# Patient Record
Sex: Male | Born: 1945 | Race: Black or African American | Hispanic: No | Marital: Married | Smoking: Never smoker
Health system: Southern US, Community
[De-identification: ages and names within clinical notes are randomized; demographics above are authoritative.]

## PROBLEM LIST (undated history)

## (undated) DIAGNOSIS — N401 Enlarged prostate with lower urinary tract symptoms: Secondary | ICD-10-CM

## (undated) DIAGNOSIS — T4145XA Adverse effect of unspecified anesthetic, initial encounter: Secondary | ICD-10-CM

## (undated) DIAGNOSIS — T8859XA Other complications of anesthesia, initial encounter: Secondary | ICD-10-CM

## (undated) DIAGNOSIS — C61 Malignant neoplasm of prostate: Secondary | ICD-10-CM

## (undated) DIAGNOSIS — G629 Polyneuropathy, unspecified: Secondary | ICD-10-CM

## (undated) DIAGNOSIS — E785 Hyperlipidemia, unspecified: Secondary | ICD-10-CM

## (undated) DIAGNOSIS — F419 Anxiety disorder, unspecified: Secondary | ICD-10-CM

## (undated) DIAGNOSIS — M1990 Unspecified osteoarthritis, unspecified site: Secondary | ICD-10-CM

## (undated) DIAGNOSIS — M51369 Other intervertebral disc degeneration, lumbar region without mention of lumbar back pain or lower extremity pain: Secondary | ICD-10-CM

## (undated) DIAGNOSIS — J301 Allergic rhinitis due to pollen: Secondary | ICD-10-CM

## (undated) DIAGNOSIS — N3941 Urge incontinence: Secondary | ICD-10-CM

## (undated) DIAGNOSIS — M5136 Other intervertebral disc degeneration, lumbar region: Secondary | ICD-10-CM

## (undated) HISTORY — PX: PROSTATE BIOPSY: SHX241

## (undated) HISTORY — DX: Anxiety disorder, unspecified: F41.9

---

## 2003-01-31 ENCOUNTER — Emergency Department (HOSPITAL_COMMUNITY): Admission: EM | Admit: 2003-01-31 | Discharge: 2003-01-31 | Payer: Self-pay | Admitting: Emergency Medicine

## 2003-01-31 ENCOUNTER — Encounter: Payer: Self-pay | Admitting: Emergency Medicine

## 2003-02-13 ENCOUNTER — Ambulatory Visit (HOSPITAL_COMMUNITY): Admission: RE | Admit: 2003-02-13 | Discharge: 2003-02-13 | Payer: Self-pay | Admitting: General Surgery

## 2003-09-07 ENCOUNTER — Ambulatory Visit (HOSPITAL_COMMUNITY): Admission: RE | Admit: 2003-09-07 | Discharge: 2003-09-07 | Payer: Self-pay | Admitting: Family Medicine

## 2003-11-14 ENCOUNTER — Ambulatory Visit (HOSPITAL_COMMUNITY): Admission: RE | Admit: 2003-11-14 | Discharge: 2003-11-14 | Payer: Self-pay | Admitting: General Surgery

## 2003-11-14 HISTORY — PX: OTHER SURGICAL HISTORY: SHX169

## 2003-11-27 ENCOUNTER — Ambulatory Visit (HOSPITAL_COMMUNITY): Admission: RE | Admit: 2003-11-27 | Discharge: 2003-11-27 | Payer: Self-pay | Admitting: Family Medicine

## 2009-03-15 ENCOUNTER — Ambulatory Visit (HOSPITAL_COMMUNITY): Admission: RE | Admit: 2009-03-15 | Discharge: 2009-03-15 | Payer: Self-pay | Admitting: General Surgery

## 2010-02-18 ENCOUNTER — Ambulatory Visit (HOSPITAL_COMMUNITY): Admission: RE | Admit: 2010-02-18 | Discharge: 2010-02-18 | Payer: Self-pay | Admitting: Family Medicine

## 2010-02-26 ENCOUNTER — Emergency Department (HOSPITAL_COMMUNITY): Admission: EM | Admit: 2010-02-26 | Discharge: 2010-02-26 | Payer: Self-pay | Admitting: General Surgery

## 2010-03-21 ENCOUNTER — Ambulatory Visit (HOSPITAL_COMMUNITY): Admission: RE | Admit: 2010-03-21 | Discharge: 2010-03-21 | Payer: Self-pay | Admitting: General Surgery

## 2010-03-21 HISTORY — PX: OTHER SURGICAL HISTORY: SHX169

## 2010-10-31 LAB — BASIC METABOLIC PANEL
GFR calc Af Amer: >60 mL/min (ref >60)
GFR calc non Af Amer: >60 mL/min (ref >60)
Glucose, Bld: 108 mg/dL — ABNORMAL HIGH (ref 70–99)
Potassium: 4 mEq/L (ref 3.5–5.1)
Sodium: 135 mEq/L (ref 135–145)

## 2010-10-31 LAB — SURGICAL PCR SCREEN
MRSA, PCR: NEGATIVE
Staphylococcus aureus: NEGATIVE

## 2010-10-31 LAB — HEMOGLOBIN AND HEMATOCRIT, BLOOD: HCT: 42 % (ref 39.0–52.0)

## 2010-12-30 NOTE — H&P (Signed)
NAME:  WAKE, [name] NO.:  [id number]   MEDICAL RECORD NO.:  [id number]          PATIENT TYPE:  AMB   LOCATION:  DAY                           FACILITY:  [facility]   PHYSICIAN:  Dalia Heading, M.D.  DATE OF BIRTH:  1945/09/14   DATE OF ADMISSION:  DATE OF DISCHARGE:  LH                              HISTORY & PHYSICAL   CHIEF COMPLAINT:  Family history of colon carcinoma.   HISTORY OF PRESENT ILLNESS:  The patient is a 65 year old black male,  who is referred for endoscopic evaluation.  He needs colonoscopy due to  a family history of colon carcinoma.  No abdominal pain, weight loss,  nausea, vomiting, diarrhea, constipation, melena, or hematochezia.  He  last had a colonoscopy many years ago.   PAST MEDICAL HISTORY:  Unremarkable.   PAST SURGICAL HISTORY:  Hemorrhoidectomy.   CURRENT MEDICATIONS:  None.   ALLERGIES:  No known drug allergies.   REVIEW OF SYSTEMS:  Noncontributory.   PHYSICAL EXAMINATION,:  GENERAL:  The patient is a well-developed, well-  nourished black male in no acute distress.  LUNGS:  Clear to auscultation with equal breath sounds bilaterally.  HEART:  Regular rate and rhythm without S3, S4, or murmurs.  ABDOMEN:  Soft, nontender, and nondistended.  No hepatosplenomegaly or  masses noted.  RECTAL:  Deferred to the procedure.   IMPRESSION:  Family history of colon carcinoma.   PLAN:  The patient is scheduled for colonoscopy on March 15, 2009.  The  risks and benefits of the procedure including bleeding and perforation  were fully explained to the patient, gave informed consent.      Dalia Heading, M.D.  Electronically Signed     MAJ/MEDQ  D:  03/12/2009  T:  03/13/2009  Job:  [id number]   cc:   [facility] at [facility], M.D.  Fax: [phone]

## 2011-01-02 NOTE — H&P (Signed)
   NAME:  Bradley Brooks, Bradley Brooks NO.:  [id number]   MEDICAL RECORD NO.:  [id number]                  PATIENT TYPE:   LOCATION:                                       FACILITY:   PHYSICIAN:  Dalia Heading, M.D.               DATE OF BIRTH:  09/01/1945   DATE OF ADMISSION:  DATE OF DISCHARGE:                                HISTORY & PHYSICAL   CHIEF COMPLAINT:  Family history of colon carcinoma.   HISTORY OF PRESENT ILLNESS:  The patient is a 65 year old black male who was  referred for a colonoscopy.  He has multiple brothers with colon cancer.  He  denies any lightheadedness, weight loss, fever, abdominal pain,  constipation, diarrhea, hematochezia, or melena.  He denies any hemorrhoidal  problems.  He has never had a colonoscopy.   PAST MEDICAL HISTORY:  Unremarkable   PAST SURGICAL HISTORY:  Unremarkable.   CURRENT MEDICATIONS:  None.   ALLERGIES:  NO KNOWN DRUG ALLERGIES.   REVIEW OF SYSTEMS:  Noncontributory.   PHYSICAL EXAMINATION:  GENERAL:  The patient is a well-developed, well-  nourished black male in no acute distress.  VITAL SIGNS:  He is afebrile, and vital signs are stable.  LUNGS:  Clear to auscultation with equal breath sounds bilaterally.  HEART:  Regular rate and rhythm without S3, S4, or murmurs.  ABDOMEN:  Soft, nontender, nondistended.  No hepatosplenomegaly, masses, or  hernias are noted.  Rectal examination was deferred until the procedure.   IMPRESSION:  Family history of colon carcinoma.    PLAN:  The patient is scheduled for a colonoscopy on 02/01/2003.  The risks  and benefits of the procedure, including bleeding and perforation were fully  explained to the patient who gave informed consent.                                               Dalia Heading, M.D.    MAJ/MEDQ  D:  01/30/2003  T:  01/30/2003  Job:  [id number]   cc:   Patrica Duel, M.D.  [address], [address]  [place]  [place] [address]  Fax:  [phone]

## 2011-01-02 NOTE — Op Note (Signed)
NAME:  Bradley Brooks, Bradley Brooks NO.:  [id number]   MEDICAL RECORD NO.:  [id number]                   PATIENT TYPE:  AMB   LOCATION:  DAY                                  FACILITY:  [facility]   PHYSICIAN:  Dalia Heading, M.D.               DATE OF BIRTH:  October 27, 1945   DATE OF PROCEDURE:  11/14/2003  DATE OF DISCHARGE:                                 OPERATIVE REPORT   PREOPERATIVE DIAGNOSIS:  Hemorrhoidal disease.   POSTOPERATIVE DIAGNOSES:  1. Hemorrhoidal disease.  2. Posterior anal fissure.   OPERATION/PROCEDURE:  1. Extensive hemorrhoidectomy.  2. Fissurectomy.   SURGEON:  Dalia Heading, M.D.   ANESTHESIA:  General.   INDICATIONS:  The patient is a 64 year old black male who presents with  hemorrhoidal disease as well as rectal pain.  He now presents to the  operating room for a hemorrhoidectomy.  The risks and benefits of the  procedure including bleeding, infection, and recurrence of the disease were  fully explained to the patient, who gave informed consent.   DESCRIPTION OF PROCEDURE:  The patient was placed in the lithotomy position  after general anesthesia was administered.  The perineum was prepped and  draped using the usual sterile technique with Betadine.  Surgical site  confirmation was performed.   On visualization of the anus, large internal and external hemorrhoid were  noted at the 4 o'clock position.  In additional, a medium sized posterior  anal fissure was noted with an associated sentinel tag.  The internal and  external hemorrhoids at the 4 o'clock position were excised without  difficulty.  The mucosal edges were reapproximated using a 2-0 Vicryl  running suture.  The fissurectomy was performed and the mucosal edges were  reapproximated using a 2-0 Vicryl running suture.  He did have good  relaxation of the internal sphincter, thus a sphincterotomy was not  performed.  Any bleeding was controlled using Bovie electrocautery.   0.5%  Sensorcaine was instilled into the surrounding region.  Surgicel and viscous  Xylocaine packing were then placed into the rectum.   All tape and needle counts correct at the end of the procedure.  The patient  was awakened and transferred to PACU in stable condition.  Complications:  None.  Specimen:  Hemorrhoids.  Blood loss minimal.      ___________________________________________                                            Dalia Heading, M.D.   MAJ/MEDQ  D:  11/14/2003  T:  11/14/2003  Job:  [id number]   cc:   Patrica Duel, M.D.  [address], [address]  [place]  [place] [address]  Fax: [phone]

## 2011-01-02 NOTE — H&P (Signed)
NAME:  Bradley Brooks NO.:  [id number]   MEDICAL RECORD NO.:  [id number]                   PATIENT TYPE:  OUT   LOCATION:  RAD                                  FACILITY:  [facility]   PHYSICIAN:  Dalia Heading, M.D.               DATE OF BIRTH:  Jan 22, 1946   DATE OF ADMISSION:  11/13/2003  DATE OF DISCHARGE:                                HISTORY & PHYSICAL   CHIEF COMPLAINT:  Hemorrhoidal disease.   HISTORY OF PRESENT ILLNESS:  The patient is a 65 year old black male who is  referred for hemorrhoidal disease.  He has had pain and irritation with the  hemorrhoids along the anus for many months.  Medications have not been  helpful.  He had a colonoscopy one year ago which was unremarkable.  He does  have a family history of colon cancer.  No hematochezia had been noted.   PAST MEDICAL HISTORY:  Unremarkable.   PAST SURGICAL HISTORY:  Unremarkable.   CURRENT MEDICATIONS:  None.   ALLERGIES:  No known drug allergies.   REVIEW OF SYSTEMS:  Noncontributory.  The patient denies any cardiopulmonary  or bleeding disorders.   PHYSICAL EXAMINATION:  GENERAL APPEARANCE:  Well-developed, well-nourished  black male in no acute distress.  VITAL SIGNS:  Afebrile, vital signs are stable.  LUNGS:  Clear to auscultation with good breath sounds bilaterally.  HEART:  Regular rate and rhythm without S3, S4 or murmurs.  ABDOMEN:  Soft, nontender, nondistended.  RECTAL:  Irritated external hemorrhoids along the left lateral aspect.  No  other masses are noted.  No anal fissures.   IMPRESSION:  Hemorrhoidal disease.   PLAN:  The patient is scheduled for hemorrhoidectomy on November 14, 2003.  Risks and benefits of the procedure including bleeding and infection were  fully explained to the patient.  Gave informed consent.  Lortab 10/650 has  been prescribed.     ___________________________________________                                         Dalia Heading,  M.D.   MAJ/MEDQ  D:  11/13/2003  T:  11/13/2003  Job:  [id number]   cc:   Patrica Duel, M.D.  [address], [address]  [place]  [place] [address]  Fax: [phone]

## 2011-08-27 ENCOUNTER — Other Ambulatory Visit: Payer: Self-pay

## 2011-08-27 ENCOUNTER — Encounter (HOSPITAL_COMMUNITY)
Admission: RE | Admit: 2011-08-27 | Discharge: 2011-08-27 | Disposition: A | Discharge status: Home / Self Care | Payer: Medicare Other | Source: Ambulatory Visit | Attending: General Surgery | Admitting: General Surgery

## 2011-08-27 ENCOUNTER — Encounter (HOSPITAL_COMMUNITY): Payer: Self-pay

## 2011-08-27 HISTORY — DX: Adverse effect of unspecified anesthetic, initial encounter: T41.45XA

## 2011-08-27 HISTORY — DX: Other complications of anesthesia, initial encounter: T88.59XA

## 2011-08-27 LAB — CBC
MCH: 28.4 pg (ref 26.0–34.0)
MCHC: 33.7 g/dL (ref 30.0–36.0)
MCV: 84.1 fL (ref 78.0–100.0)
Platelets: 144 10*3/uL — ABNORMAL LOW (ref 150–400)
RBC: 4.97 MIL/uL (ref 4.22–5.81)
RDW: 12.8 % (ref 11.5–15.5)

## 2011-08-27 LAB — DIFFERENTIAL
Basophils Absolute: 0 10*3/uL (ref 0.0–0.1)
Basophils Relative: 1 % (ref 0–1)
Eosinophils Absolute: 0.2 10*3/uL (ref 0.0–0.7)
Eosinophils Relative: 4 % (ref 0–5)
Neutrophils Relative %: 27 % — ABNORMAL LOW (ref 43–77)

## 2011-08-27 LAB — BASIC METABOLIC PANEL
BUN: 10 mg/dL (ref 6–23)
CO2: 29 mEq/L (ref 19–32)
Calcium: 10 mg/dL (ref 8.4–10.5)
Creatinine, Ser: 0.95 mg/dL (ref 0.50–1.35)
GFR calc non Af Amer: 84 mL/min — ABNORMAL LOW (ref >90)
Glucose, Bld: 102 mg/dL — ABNORMAL HIGH (ref 70–99)
Sodium: 138 mEq/L (ref 135–145)

## 2011-08-27 NOTE — Patient Instructions (Addendum)
20 Bradley Brooks  08/27/2011   Your procedure is scheduled on:  08/31/11  Report to [facility] at 09:00 AM.  Call this number if you have problems the morning of surgery: [phone]   Remember:   Do not eat food:After Midnight.  May have clear liquids:until Midnight .  Clear liquids include soda, tea, black coffee, apple or grape juice, broth.  Take these medicines the morning of surgery with A SIP OF WATER: none   Do not wear jewelry, make-up or nail polish.  Do not wear lotions, powders, or perfumes. You may wear deodorant.  Do not shave 48 hours prior to surgery.  Do not bring valuables to the hospital.  Contacts, dentures or bridgework may not be worn into surgery.  Leave suitcase in the car. After surgery it may be brought to your room.  For patients admitted to the hospital, checkout time is 11:00 AM the day of discharge.   Patients discharged the day of surgery will not be allowed to drive home.  Name and phone number of your driver:   Special Instructions: CHG Shower Use Special Wash: 1/2 bottle night before surgery and 1/2 bottle morning of surgery.   Please read over the following fact sheets that you were given: Pain Booklet, MRSA Information, Surgical Site Infection Prevention, Anesthesia Post-op Instructions and Care and Recovery After Surgery    Hernia, Surgical Repair A hernia occurs when an internal organ pushes out through a weak spot in the belly (abdominal) wall muscles. Hernias commonly occur in the groin and around the navel. Hernias often can be pushed back into place (reduced). Most hernias tend to get worse over time. Problems occur when abdominal contents get stuck in the opening (incarcerated hernia). The blood supply gets cut off (strangulated hernia). This is an emergency and needs surgery. Otherwise, hernia repair can be an elective procedure. This means you can schedule this at your convenience when an emergency is not present. Because complications can occur,  if you decide to repair the hernia, it is best to do it soon. When it becomes an emergency procedure, there is increased risk of complications after surgery. CAUSES   Heavy lifting.   Obesity.   Prolonged coughing.   Straining to move your bowels.   Hernias can also occur through a cut (incision) by a surgeonafter an abdominal operation.  HOME CARE INSTRUCTIONS Before the repair:  Bed rest is not required. You may continue your normal activities, but avoid heavy lifting (more than 10 pounds) or straining. Cough gently. If you are a smoker, it is best to stop. Even the best hernia repair can break down with the continual strain of coughing.   Do not wear anything tight over your hernia. Do not try to keep it in with an outside bandage or truss. These can damage abdominal contents if they are trapped in the hernia sac.   Eat a normal diet. Avoid constipation. Straining over long periods of time to have a bowel movement will increase hernia size. It also can breakdown repairs. If you cannot do this with diet alone, laxatives or stool softeners may be used.  PRIOR TO SURGERY, SEEK IMMEDIATE MEDICAL CARE IF: You have problems (symptoms) of a trapped (incarcerated) hernia. Symptoms include:  An oral temperature above 102 F (38.9 C) develops, or as your caregiver suggests.   Increasing abdominal pain.   Feeling sick to your stomach(nausea) and vomiting.   You stop passing gas or stool.   The hernia is  stuck outside the abdomen, looks discolored, feels hard, or is tender.   You have any changes in your bowel habits or in the hernia that is unusual for you.  LET YOUR CAREGIVERS KNOW ABOUT THE FOLLOWING:  Allergies.   Medications taken including herbs, eye drops, over the counter medications, and creams.   Use of steroids (by mouth or creams).   Family or personal history of problems with anesthetics or Novocaine.   Possibility of pregnancy, if this applies.   Personal history  of blood clots (thrombophlebitis).   Family or personal history of bleeding or blood problems.   Previous surgery.   Other health problems.  BEFORE THE PROCEDURE You should be present 1 hour prior to your procedure, or as directed by your caregiver.  AFTER THE PROCEDURE After surgery, you will be taken to the recovery area. A nurse will watch and check your progress there. Once you are awake, stable, and taking fluids well, you will be allowed to go home as long as there are no problems. Once home, an ice pack (wrapped in a light towel) applied to your operative site may help with discomfort. It may also keep the swelling down. Do not lift anything heavier than 10 pounds (4.55 kilograms). Take showers not baths. Do not drive while taking narcotics. Follow instructions as suggested by your caregiver.  SEEK IMMEDIATE MEDICAL CARE IF: After surgery:  There is redness, swelling, or increasing pain in the wound.   There is pus coming from the wound.   There is drainage from a wound lasting longer than 1 day.   An unexplained oral temperature above 102 F (38.9 C) develops.   You notice a foul smell coming from the wound or dressing.   There is a breaking open of a wound (edged not staying together) after the sutures have been removed.   You notice increasing pain in the shoulders (shoulder strap areas).   You develop dizzy episodes or fainting while standing.   You develop persistent nausea or vomiting.   You develop a rash.   You have difficulty breathing.   You develop any reaction or side effects to medications given.  MAKE SURE YOU:   Understand these instructions.   Will watch your condition.   Will get help right away if you are not doing well or get worse.  Document Released: 01/27/2001 Document Revised: 04/15/2011 Document Reviewed: 12/20/2007 [facility] Patient Information 2012 [place], [place].   PATIENT INSTRUCTIONS POST-ANESTHESIA  IMMEDIATELY FOLLOWING  SURGERY:  Do not drive or operate machinery for the first twenty four hours after surgery.  Do not make any important decisions for twenty four hours after surgery or while taking narcotic pain medications or sedatives.  If you develop intractable nausea and vomiting or a severe headache please notify your doctor immediately.  FOLLOW-UP:  Please make an appointment with your surgeon as instructed. You do not need to follow up with anesthesia unless specifically instructed to do so.  WOUND CARE INSTRUCTIONS (if applicable):  Keep a dry clean dressing on the anesthesia/puncture wound site if there is drainage.  Once the wound has quit draining you may leave it open to air.  Generally you should leave the bandage intact for twenty four hours unless there is drainage.  If the epidural site drains for more than 36-48 hours please call the anesthesia department.  QUESTIONS?:  Please feel free to call your physician or the hospital operator if you have any questions, and they will be happy to  assist you.     [facility] Anesthesia Department [address] [place] [phone]

## 2011-08-31 ENCOUNTER — Ambulatory Visit (HOSPITAL_COMMUNITY)
Admission: RE | Admit: 2011-08-31 | Discharge: 2011-08-31 | Disposition: A | Discharge status: Home / Self Care | Payer: Medicare Other | Source: Ambulatory Visit | Attending: General Surgery | Admitting: General Surgery

## 2011-08-31 ENCOUNTER — Ambulatory Visit (HOSPITAL_COMMUNITY): Payer: Medicare Other | Admitting: Anesthesiology

## 2011-08-31 ENCOUNTER — Encounter (HOSPITAL_COMMUNITY): Payer: Self-pay | Admitting: Anesthesiology

## 2011-08-31 ENCOUNTER — Encounter (HOSPITAL_COMMUNITY): Payer: Self-pay | Admitting: *Deleted

## 2011-08-31 ENCOUNTER — Encounter (HOSPITAL_COMMUNITY)
Admission: RE | Disposition: A | Discharge status: Home / Self Care | Payer: Self-pay | Source: Ambulatory Visit | Attending: General Surgery

## 2011-08-31 DIAGNOSIS — Z01810 Encounter for preprocedural cardiovascular examination: Secondary | ICD-10-CM | POA: Insufficient documentation

## 2011-08-31 DIAGNOSIS — Z01812 Encounter for preprocedural laboratory examination: Secondary | ICD-10-CM | POA: Insufficient documentation

## 2011-08-31 DIAGNOSIS — K4090 Unilateral inguinal hernia, without obstruction or gangrene, not specified as recurrent: Secondary | ICD-10-CM | POA: Insufficient documentation

## 2011-08-31 HISTORY — PX: INGUINAL HERNIA REPAIR: SHX194

## 2011-08-31 SURGERY — REPAIR, HERNIA, INGUINAL, ADULT
Anesthesia: Spinal | Site: Groin | Laterality: Left | Wound class: Clean

## 2011-08-31 MED ORDER — CEFAZOLIN SODIUM 1-5 GM-% IV SOLN
INTRAVENOUS | Status: DC | PRN
  Administered 2011-08-31: 1 g via INTRAVENOUS

## 2011-08-31 MED ORDER — FENTANYL CITRATE 0.05 MG/ML IJ SOLN
INTRAMUSCULAR | Status: AC
  Administered 2011-08-31: 50 ug via INTRAVENOUS
  Filled 2011-08-31: qty 2

## 2011-08-31 MED ORDER — CEFAZOLIN SODIUM 1-5 GM-% IV SOLN: 1.0000 g | INTRAVENOUS | Status: DC

## 2011-08-31 MED ORDER — PROPOFOL 10 MG/ML IV EMUL
INTRAVENOUS | Status: DC | PRN
  Administered 2011-08-31: 100 ug/kg/min via INTRAVENOUS

## 2011-08-31 MED ORDER — ENOXAPARIN SODIUM 40 MG/0.4ML ~~LOC~~ SOLN
SUBCUTANEOUS | Status: AC
  Administered 2011-08-31: 40 mg via SUBCUTANEOUS
  Filled 2011-08-31: qty 0.4

## 2011-08-31 MED ORDER — SODIUM CHLORIDE 0.9 % IR SOLN: Status: DC | PRN

## 2011-08-31 MED ORDER — ENOXAPARIN SODIUM 40 MG/0.4ML ~~LOC~~ SOLN
40.0000 mg | Freq: Once | SUBCUTANEOUS | Status: AC
  Administered 2011-08-31: 40 mg via SUBCUTANEOUS

## 2011-08-31 MED ORDER — LACTATED RINGERS IV SOLN
INTRAVENOUS | Status: DC
  Administered 2011-08-31: 1000 mL via INTRAVENOUS

## 2011-08-31 MED ORDER — STERILE WATER FOR IRRIGATION IR SOLN
Status: DC | PRN
  Administered 2011-08-31: 1000 mL

## 2011-08-31 MED ORDER — ONDANSETRON HCL 4 MG/2ML IJ SOLN: 4.0000 mg | Freq: Once | INTRAMUSCULAR | Status: DC | PRN

## 2011-08-31 MED ORDER — LIDOCAINE HCL (PF) 1 % IJ SOLN
INTRAMUSCULAR | Status: AC
  Filled 2011-08-31: qty 5

## 2011-08-31 MED ORDER — FENTANYL CITRATE 0.05 MG/ML IJ SOLN
INTRAMUSCULAR | Status: AC
  Filled 2011-08-31: qty 2

## 2011-08-31 MED ORDER — MIDAZOLAM HCL 2 MG/2ML IJ SOLN
1.0000 mg | INTRAMUSCULAR | Status: DC | PRN
  Administered 2011-08-31 (×2): 2 mg via INTRAVENOUS

## 2011-08-31 MED ORDER — FENTANYL CITRATE 0.05 MG/ML IJ SOLN
INTRAMUSCULAR | Status: DC | PRN
Start: 2011-08-31 — End: 2011-08-31
  Administered 2011-08-31 (×2): 25 ug via INTRAVENOUS
  Administered 2011-08-31: 30 ug via INTRAVENOUS

## 2011-08-31 MED ORDER — FENTANYL CITRATE 0.05 MG/ML IJ SOLN
INTRAMUSCULAR | Status: DC | PRN
  Administered 2011-08-31: 20 ug via INTRATHECAL

## 2011-08-31 MED ORDER — BUPIVACAINE HCL (PF) 0.5 % IJ SOLN
INTRAMUSCULAR | Status: AC
  Filled 2011-08-31: qty 30

## 2011-08-31 MED ORDER — MIDAZOLAM HCL 2 MG/2ML IJ SOLN
INTRAMUSCULAR | Status: AC
  Filled 2011-08-31: qty 2

## 2011-08-31 MED ORDER — CHLORHEXIDINE GLUCONATE 4 % EX LIQD
1.0000 "application " | Freq: Once | CUTANEOUS | Status: DC
  Filled 2011-08-31: qty 15

## 2011-08-31 MED ORDER — BUPIVACAINE HCL (PF) 0.5 % IJ SOLN
INTRAMUSCULAR | Status: DC | PRN
  Administered 2011-08-31: 10 mL

## 2011-08-31 MED ORDER — CELECOXIB 100 MG PO CAPS: 400.0000 mg | ORAL_CAPSULE | Freq: Every day | ORAL | Status: DC

## 2011-08-31 MED ORDER — LACTATED RINGERS IV SOLN: INTRAVENOUS | Status: DC

## 2011-08-31 MED ORDER — HYDROCODONE-ACETAMINOPHEN 5-325 MG PO TABS
1.0000 | ORAL_TABLET | ORAL | Status: AC | PRN
Start: 2011-08-31 — End: 2011-09-10

## 2011-08-31 MED ORDER — FENTANYL CITRATE 0.05 MG/ML IJ SOLN
25.0000 ug | INTRAMUSCULAR | Status: DC | PRN
  Administered 2011-08-31 (×3): 50 ug via INTRAVENOUS

## 2011-08-31 MED ORDER — PROPOFOL 10 MG/ML IV EMUL
INTRAVENOUS | Status: AC
  Filled 2011-08-31: qty 20

## 2011-08-31 MED ORDER — MIDAZOLAM HCL 2 MG/2ML IJ SOLN
INTRAMUSCULAR | Status: AC
  Administered 2011-08-31: 2 mg via INTRAVENOUS
  Filled 2011-08-31: qty 2

## 2011-08-31 MED ORDER — CEFAZOLIN SODIUM 1-5 GM-% IV SOLN
INTRAVENOUS | Status: AC
  Filled 2011-08-31: qty 50

## 2011-08-31 MED ORDER — LACTATED RINGERS IV SOLN
INTRAVENOUS | Status: DC | PRN
  Administered 2011-08-31: 09:00:00 via INTRAVENOUS

## 2011-08-31 MED ORDER — BUPIVACAINE IN DEXTROSE 0.75-8.25 % IT SOLN
INTRATHECAL | Status: AC
  Filled 2011-08-31: qty 2

## 2011-08-31 SURGICAL SUPPLY — 38 items
BAG HAMPER (MISCELLANEOUS) ×2 IMPLANT
BENZOIN TINCTURE PRP APPL 2/3 (GAUZE/BANDAGES/DRESSINGS) IMPLANT
CLOTH BEACON ORANGE TIMEOUT ST (SAFETY) ×2 IMPLANT
COVER LIGHT HANDLE STERIS (MISCELLANEOUS) ×4 IMPLANT
DECANTER SPIKE VIAL GLASS SM (MISCELLANEOUS) ×2 IMPLANT
DRAIN PENROSE 18X.75 LTX STRL (MISCELLANEOUS) ×2 IMPLANT
DURAPREP 26ML APPLICATOR (WOUND CARE) ×2 IMPLANT
ELECT REM PT RETURN 9FT ADLT (ELECTROSURGICAL) ×2
ELECTRODE REM PT RTRN 9FT ADLT (ELECTROSURGICAL) ×1 IMPLANT
FORMALIN 10 PREFIL 120ML (MISCELLANEOUS) ×2 IMPLANT
GLOVE BIOGEL PI IND STRL 7.5 (GLOVE) ×1 IMPLANT
GLOVE BIOGEL PI INDICATOR 7.5 (GLOVE) ×1
GLOVE ECLIPSE 6.5 STRL STRAW (GLOVE) ×4 IMPLANT
GLOVE ECLIPSE 7.0 STRL STRAW (GLOVE) ×2 IMPLANT
GLOVE INDICATOR 7.0 STRL GRN (GLOVE) ×4 IMPLANT
GOWN STRL REIN XL XLG (GOWN DISPOSABLE) ×6 IMPLANT
INST SET MINOR GENERAL (KITS) ×2 IMPLANT
KIT ROOM TURNOVER APOR (KITS) ×2 IMPLANT
MANIFOLD NEPTUNE II (INSTRUMENTS) ×2 IMPLANT
MESH HERNIA 1.6X1.9 PLUG LRG (Mesh General) ×1 IMPLANT
MESH HERNIA PLUG LRG (Mesh General) ×1 IMPLANT
NEEDLE HYPO 25X1 1.5 SAFETY (NEEDLE) ×2 IMPLANT
NS IRRIG 1000ML POUR BTL (IV SOLUTION) ×2 IMPLANT
PACK MINOR (CUSTOM PROCEDURE TRAY) ×2 IMPLANT
PAD ARMBOARD 7.5X6 YLW CONV (MISCELLANEOUS) ×2 IMPLANT
SET BASIN LINEN APH (SET/KITS/TRAYS/PACK) ×2 IMPLANT
STRIP CLOSURE SKIN 1/2X4 (GAUZE/BANDAGES/DRESSINGS) ×2 IMPLANT
SUT ETHIBOND NAB MO 7 #0 18IN (SUTURE) ×4 IMPLANT
SUT MNCRL AB 4-0 PS2 18 (SUTURE) ×2 IMPLANT
SUT SILK 2 0 (SUTURE)
SUT SILK 2-0 18XBRD TIE 12 (SUTURE) IMPLANT
SUT VIC AB 2-0 CT1 27 (SUTURE) ×2
SUT VIC AB 2-0 CT1 TAPERPNT 27 (SUTURE) ×2 IMPLANT
SUT VIC AB 3-0 SH 27 (SUTURE) ×1
SUT VIC AB 3-0 SH 27X BRD (SUTURE) ×1 IMPLANT
SUT VICRYL AB 3 0 TIES (SUTURE) IMPLANT
SYR BULB IRRIGATION 50ML (SYRINGE) ×2 IMPLANT
SYR CONTROL 10ML LL (SYRINGE) ×2 IMPLANT

## 2011-08-31 NOTE — Preoperative (Signed)
Beta Blockers   Reason not to administer Beta Blockers:Not Applicable 

## 2011-08-31 NOTE — Interval H&P Note (Signed)
History and Physical Interval Note:  08/31/2011 9:22 AM  Bradley Brooks  has presented today for surgery, with the diagnosis of Inguinal hernia without mention of obstruction or gangrene, unilateral or unspecified, not specified as recurrent  The various methods of treatment have been discussed with the patient and family. After consideration of risks, benefits and other options for treatment, the patient has consented to  Procedure(s): HERNIA REPAIR INGUINAL ADULT as a surgical intervention .  The patients' history has been reviewed, patient examined, no change in status, stable for surgery.  I have reviewed the patients' chart and labs.  Questions were answered to the patient's satisfaction.     Khadijatou Borak C

## 2011-08-31 NOTE — Anesthesia Preprocedure Evaluation (Signed)
Anesthesia Evaluation  Patient identified by MRN, date of birth, ID band Patient awake    Reviewed: Allergy & Precautions, H&P , NPO status , Patient's Chart, lab work & pertinent test results  History of Anesthesia Complications (+) Emergence Delirium  Airway Mallampati: I      Dental  (+) Teeth Intact   Pulmonary neg pulmonary ROS,  clear to auscultation        Cardiovascular neg cardio ROS Regular Normal    Neuro/Psych Negative Psych ROS   GI/Hepatic   Endo/Other    Renal/GU      Musculoskeletal   Abdominal   Peds  Hematology   Anesthesia Other Findings   Reproductive/Obstetrics                           Anesthesia Physical Anesthesia Plan  ASA: I  Anesthesia Plan: Spinal   Post-op Pain Management:    Induction:   Airway Management Planned: Nasal Cannula  Additional Equipment:   Intra-op Plan:   Post-operative Plan:   Informed Consent: I have reviewed the patients History and Physical, chart, labs and discussed the procedure including the risks, benefits and alternatives for the proposed anesthesia with the patient or authorized representative who has indicated his/her understanding and acceptance.     Plan Discussed with:   Anesthesia Plan Comments:         Anesthesia Quick Evaluation

## 2011-08-31 NOTE — Op Note (Signed)
Patient:  Bradley Brooks  DOB:  01/22/1946  MRN:  [id number]   Preop Diagnosis:  Left inguinal hernia  Postop Diagnosis:  The same  Procedure:  Left inguinal hernia repair with mesh  Surgeon:  Dr. Tilford Pillar  Anes:  Spinal, 0.5% Sensorcaine plain  Indications:  Patient is a 66 year old male presented my office with a bulge in the left groin. Workup and evaluation was consistent for a left inguinal hernia. Risks benefits alternatives were discussed at length the patient including but not limited to risk of bleeding, infection, infection and mesh requiring removal and subsequent repair, ischemic orchiditis, testicular loss, local paresthesia, chronic pain, intraoperative cardiac and pulmonary events. His questions and concerns are addressed the patient was consented for the planned procedure.  Procedure note:  Patient is taken to the operating room is placed into decubitus position at which time the spinal anesthesia is administered by the nurse anesthetist. At this point the patient is placed back into a supine position. Fracture of the spinal are tested with stimuli in the left groin. With demonstration of adequate spinal anesthesia the groin was prepped and draped in standard fashion. A skin incision was created with a 10 blade scalpel with additional dissection down to subcuticular tissues including Scarpa's fascia and lift her cautery. This dissection is carried out down to the external oblique fascia. The fascia is scored with a 15 blade scalpel and opened medially to the external inguinal ring using Metzenbaum scissors. At this point a large hernia sac is encountered. The hernia sac and cord structures were dissected from the surrounding canal walls and a window was created over the pubic tubercle with blunt digital dissection behind the cord structures and hernia. A Penrose drain was placed behind the cord structures to help elevate and mobilize the cord structures. At this point careful  dissection was utilized to free the hernia sac from the cord structures. The sac is noted to be extremely large and I opted at this point to enter into the sac. Upon entering into the sac I did identify a portion of colon which is suspected to be the patient's sigmoid colon. The colon is healthy and viable and was returned back into the dominant cavity. Inspection of the fascial defect demonstrated a large fascial opening. I did reapproximate a portion of the fascial edges with imbricating 0 Ethibond sutures. A large mesh plug was also placed into the defect was secured to the fascial edges using the 0 Ethibond sutures. At this point the mesh onlay was brought to the field. It was pexed medially over the pubic tubercle inferiorly to the shelving portion of the inguinal ligament and superiorly to the conjoined tendon. The tails of the onlay are secured to each other around the cord structures and laterally tucked under the external oblique fascia. All pexing sutures were 0 Ethibond sutures in simple interrupted fashion. The field was then irrigated. Hemostasis is excellent. At this point turned attention to closure. A 2-0 Vicryl was utilized to reapproximate the external oblique fascia in a running continuous fashion. The local anesthetic is instilled. A 3-0 Vicryl was utilized to reapproximate Scarpa's fascia and the deep subcuticular tissue. The skin edges were reapproximated with a 4-0 Monocryl in a running subcutaneous suture. The skin was washed and dry with moist dry towel. Benzoin is applied around incision. Half-inch Steri-Strips are placed. The drapes were removed. Both testicles are confirmed to be within the scrotum. At this point the patient was transferred back to a regular  hospital bed and transferred to the postanesthetic care unit in stable condition. At the conclusion of procedure all instrument, sponge, needle counts are correct. Patient tolerated procedure extremely well.   Complications:  None  apparent  EBL:  Minimal  Specimen:  None

## 2011-08-31 NOTE — H&P (Signed)
  NTS SOAP Note  Vital Signs:  Vitals as of: 08/25/2011: Systolic 154: Diastolic 88: Heart Rate 57: Temp 97.57F: Height 26ft 11in: Weight 212Lbs 5 Ounces: Pain Level 0: BMI 30  BMI : 29.61 kg/m2  Subjective: This 65 Years 37 Months old Male presents for of hernia.  Patient presents for referral of a bulge in the Left groin.  Has been present for years.  Increased discomfort and pain with straining.  Occassionally increased with constipation,   No fever or chills.  No signs or symptoms of incarceration.  No similar issues in the past.  Review of Symptoms:  Constitutional:unremarkable Head:unremarkable Eyes:unremarkable Nose/Mouth/Throat:unremarkable Cardiovascular:unremarkable recent URI as per HPI Genitourinary:unremarkable Musculoskeletal:unremarkable Skin:unremarkable Breast:unremarkable Hematolgic/Lymphatic:unremarkable Allergic/Immunologic:unremarkable   Past Medical History:Reviewed   Past Medical History  Surgical History: Hemorrhoidectomy, Cyst. Medical Problems: none Psychiatric History: none Allergies: Restoril Medications: none   Social History:Reviewed   Social History  Preferred Language: English ([place]) Ethnicity: Not Hispanic / Latino Age: 66 Years 9 Months Marital Status:  M Alcohol: none Recreational drug(s): none   Smoking Status: Never smoker reviewed on 08/25/2011  Family History:Reviewed   Family History  Is there a family history of: Noncontributory    Objective Information: General:Well appearing, well nourished in no distress. Skin:no rash or prominent lesions Head:Atraumatic; no masses; no abnormalities Eyes:conjunctiva clear, EOM intact, PERRL Mouth:Mucous membranes moist, no mucosal lesions. Neck:Supple without lymphadenopathy.  Heart:RRR, no murmur Lungs:CTA bilaterally, no wheezes, rhonchi, rales.  Breathing unlabored. Abdomen:Soft, NT/ND, no  HSM, no masses.  +LIH.  No palpable RIH or Umb. hernia. Extremities:No deformities, clubbing, cyanosis, or edema.   Assessment:  Diagnosis &amp; Procedure: DiagnosisCode: 550.90, ProcedureCode: 40981,    Plan: LIH.  Discussed repair.  Patient wishes to proceed.  Will schedule.  Ssx of incarceration and strangulation discussed.  Patient Education:Alternative treatments to surgery were discussed with patient (and family).Risks and benefits  of procedure were fully explained to the patient (and family) who gave informed consent. Patient/family questions were addressed.

## 2011-08-31 NOTE — Anesthesia Procedure Notes (Addendum)
Performed by: Corena Pilgrim    Date/Time: 08/31/2011 9:31 AM Performed by: Carolyne Littles, AMY Pre-anesthesia Checklist: Patient identified, Timeout performed, Emergency Drugs available, Suction available and Patient being monitored Patient Re-evaluated:Patient Re-evaluated prior to inductionOxygen Delivery Method: Simple face mask    Spinal  Patient location during procedure: OR Start time: 08/31/2011 9:48 AM Staffing CRNA/Resident: ANDRAZA, AMY Preanesthetic Checklist Completed: patient identified, site marked, surgical consent, pre-op evaluation, timeout performed, IV checked, risks and benefits discussed and monitors and equipment checked Spinal Block Patient position: left lateral decubitus Prep: Betadine Patient monitoring: heart rate, cardiac monitor, continuous pulse ox and blood pressure Approach: left paramedian Location: L2-3 Needle Needle type: Spinocan  Needle length: 9 cm Assessment Sensory level: T8 Additional Notes 0948 Sensorcaine .75% 1.5 cc and Fentanyl 20 mcg injected  Lot [id number]  Exp 2013/11

## 2011-08-31 NOTE — Anesthesia Postprocedure Evaluation (Signed)
  Anesthesia Post-op Note  Patient: Bradley Brooks  Procedure(s) Performed:  HERNIA REPAIR INGUINAL ADULT  Patient Location: PACU  Anesthesia Type: Spinal  Level of Consciousness: awake, alert  and oriented  Airway and Oxygen Therapy: Patient Spontanous Breathing and Patient connected to face mask oxygen  Post-op Pain: none  Post-op Assessment: Post-op Vital signs reviewed, Patient's Cardiovascular Status Stable, Respiratory Function Stable, Patent Airway and Adequate PO intake  Post-op Vital Signs: Reviewed and stable  Complications: No apparent anesthesia complications

## 2011-08-31 NOTE — Transfer of Care (Signed)
Immediate Anesthesia Transfer of Care Note  Patient: Bradley Brooks  Procedure(s) Performed:  HERNIA REPAIR INGUINAL ADULT  Patient Location: PACU  Anesthesia Type: Spinal  Level of Consciousness: awake, alert , oriented and patient cooperative  Airway & Oxygen Therapy: Patient Spontanous Breathing and Patient connected to face mask oxygen  Post-op Assessment: Report given to PACU RN and Post -op Vital signs reviewed and stable  Post vital signs: Reviewed and stable Filed Vitals:   08/31/11 1116  BP: 118/73  Pulse: 55  Temp: 36.5 C  Resp: 16    Complications: No apparent anesthesia complications

## 2011-09-02 ENCOUNTER — Encounter (HOSPITAL_COMMUNITY): Payer: Self-pay | Admitting: General Surgery

## 2011-09-03 ENCOUNTER — Emergency Department (HOSPITAL_COMMUNITY)
Admission: EM | Admit: 2011-09-03 | Discharge: 2011-09-03 | Disposition: A | Discharge status: Home / Self Care | Payer: Medicare Other | Attending: Emergency Medicine | Admitting: Emergency Medicine

## 2011-09-03 ENCOUNTER — Encounter (HOSPITAL_COMMUNITY): Payer: Self-pay

## 2011-09-03 ENCOUNTER — Emergency Department (HOSPITAL_COMMUNITY): Payer: Medicare Other

## 2011-09-03 ENCOUNTER — Other Ambulatory Visit: Payer: Self-pay

## 2011-09-03 DIAGNOSIS — R0602 Shortness of breath: Secondary | ICD-10-CM | POA: Insufficient documentation

## 2011-09-03 NOTE — ED Notes (Signed)
Ambulated pt around nurses station at this time pt had nad noted at this time no cp or sob remained 100% and hr was 65-69 the entire time. Bradley Brooks

## 2011-09-03 NOTE — ED Provider Notes (Signed)
History     CSN: [id number]  Arrival date & time 09/03/11  1517   First MD Initiated Contact with Patient 09/03/11 1700      Chief Complaint  Patient presents with  . Shortness of Breath     Patient is a 66 y.o. male presenting with shortness of breath. The history is provided by the patient.  Shortness of Breath  The current episode started yesterday. The onset was gradual. The problem occurs rarely. The problem has been rapidly improving. The problem is mild. The symptoms are relieved by rest (standing/walking). The symptoms are aggravated by nothing. Associated symptoms include shortness of breath. Pertinent negatives include no chest pain, no chest pressure, no fever, no sore throat, no cough and no wheezing.  Pt reports last night he ate dinner, no choking/coughing, soon after felt "couldn't catch my breath" and he began to "sniff" a lot.  This resolved after walking, lasted several minutes No cp/weakness/nausea/vomiting/diaphoresis He reports today he ate mushrooms and soon after felt same symptoms, stood up and walked and it improved.  Family reports he was "sniffing" and reported he could not catch his breath but he appeared well.    Pt has no symptoms at this time No dyspnea on exertion  Recent inguinal hernia surgery, no new pain/fever/chills, reports his incision is healing well   Past Medical History  Diagnosis Date  . Complication of anesthesia     pt sts was very combative when coming out of anesthesia from hemorrhoids surgery. Did not do this with sebaceous cyst or either tcs    Past Surgical History  Procedure Date  . Hemorrhoid surgery 1995  . Irrigation and debridement sebaceous cyst     ziegler  . Inguinal hernia repair 08/31/2011    Procedure: HERNIA REPAIR INGUINAL ADULT;  Surgeon: Fabio Bering, MD;  Location: [facility];  Service: General;  Laterality: Left;    Family History  Problem Relation Age of Onset  . Anesthesia problems Neg Hx   . Hypotension  Neg Hx   . Malignant hyperthermia Neg Hx   . Pseudochol deficiency Neg Hx     History  Substance Use Topics  . Smoking status: Never Smoker   . Smokeless tobacco: Not on file  . Alcohol Use: No      Review of Systems  Constitutional: Negative for fever.  HENT: Negative for sore throat.   Respiratory: Positive for shortness of breath. Negative for cough and wheezing.   Cardiovascular: Negative for chest pain.  All other systems reviewed and are negative.    Allergies  Review of patient's allergies indicates no known allergies.  Home Medications   Current Outpatient Rx  Name Route Sig Dispense Refill  . DOCUSATE SODIUM 100 MG PO CAPS Oral Take 100 mg by mouth daily.    Marland Kitchen HYDROCODONE-ACETAMINOPHEN 5-325 MG PO TABS Oral Take 1-2 tablets by mouth every 4 (four) hours as needed for pain. 45 tablet 0  . IBUPROFEN 200 MG PO TABS Oral Take 200 mg by mouth every 6 (six) hours as needed. For pain      BP 146/83  Pulse 60  Temp(Src) 97.8 F (36.6 C) (Oral)  Resp 20  Ht 5\' 11"  (1.803 m)  Wt 213 lb (96.616 kg)  BMI 29.71 kg/m2  SpO2 100%  Physical Exam CONSTITUTIONAL: Well developed/well nourished HEAD AND FACE: Normocephalic/atraumatic EYES: EOMI/PERRL ENMT: Mucous membranes moist NECK: supple no meningeal signs SPINE:entire spine nontender CV: S1/S2 noted, no murmurs/rubs/gallops noted LUNGS: Lungs are clear  to auscultation bilaterally, no apparent distress ABDOMEN: soft, nontender, no rebound or guarding Well healed inguinal hernia incision, clean/dry/intact, nontender GU:no cva tenderness NEURO: Pt is awake/alert, moves all extremitiesx4 EXTREMITIES: pulses normal, full ROM, no edema, no tenderness SKIN: warm, color normal PSYCH: no abnormalities of mood noted  ED Course  Procedures   5:46 PM Pt well appearing No distress, no complaints He is postop, but appears well and clinical suspicion for PE/ACS/CHF/infectious etiology low  6:49 PM Pt walking around  ED in no distress Well appearing Discussed strict return precautions  The patient appears reasonably screened and/or stabilized for discharge and I doubt any other medical condition or other [facility] requiring further screening, evaluation, or treatment in the ED at this time prior to discharge.    MDM  Nursing notes reviewed and considered in documentation xrays reviewed and considered Previous records reviewed and considered    Date: 09/03/2011  Rate: 54  Rhythm: sinus bradycardia  QRS Axis: normal  Intervals: normal  ST/T Wave abnormalities: normal  Conduction Disutrbances:nonspecific intraventricular conduction delay  Narrative Interpretation:   Old EKG Reviewed: unchanged          Joya Gaskins, MD 09/03/11 1850

## 2011-09-03 NOTE — ED Notes (Signed)
Pt presents with SOB after eating. Pt states on Monday he had Hernia Surgery and since yesterday when he eats or tries to drink water he feels SOB. NAD at this time. Pt denies SOB at this time.

## 2011-09-05 ENCOUNTER — Emergency Department (HOSPITAL_COMMUNITY): Payer: Medicare Other

## 2011-09-05 ENCOUNTER — Encounter (HOSPITAL_COMMUNITY): Payer: Self-pay

## 2011-09-05 ENCOUNTER — Emergency Department (HOSPITAL_COMMUNITY)
Admission: EM | Admit: 2011-09-05 | Discharge: 2011-09-05 | Disposition: A | Discharge status: Home / Self Care | Payer: Medicare Other | Attending: Emergency Medicine | Admitting: Emergency Medicine

## 2011-09-05 DIAGNOSIS — F411 Generalized anxiety disorder: Secondary | ICD-10-CM | POA: Insufficient documentation

## 2011-09-05 DIAGNOSIS — R0602 Shortness of breath: Secondary | ICD-10-CM | POA: Insufficient documentation

## 2011-09-05 DIAGNOSIS — Z9889 Other specified postprocedural states: Secondary | ICD-10-CM | POA: Insufficient documentation

## 2011-09-05 DIAGNOSIS — R1310 Dysphagia, unspecified: Secondary | ICD-10-CM | POA: Insufficient documentation

## 2011-09-05 DIAGNOSIS — R209 Unspecified disturbances of skin sensation: Secondary | ICD-10-CM | POA: Insufficient documentation

## 2011-09-05 DIAGNOSIS — F419 Anxiety disorder, unspecified: Secondary | ICD-10-CM

## 2011-09-05 DIAGNOSIS — R350 Frequency of micturition: Secondary | ICD-10-CM | POA: Insufficient documentation

## 2011-09-05 LAB — COMPREHENSIVE METABOLIC PANEL
BUN: 9 mg/dL (ref 6–23)
Calcium: 10.4 mg/dL (ref 8.4–10.5)
Creatinine, Ser: 0.86 mg/dL (ref 0.50–1.35)
GFR calc Af Amer: >90 mL/min (ref >90)
Glucose, Bld: 105 mg/dL — ABNORMAL HIGH (ref 70–99)
Sodium: 136 mEq/L (ref 135–145)
Total Protein: 7.4 g/dL (ref 6.0–8.3)

## 2011-09-05 LAB — DIFFERENTIAL
Eosinophils Absolute: 0.2 10*3/uL (ref 0.0–0.7)
Eosinophils Relative: 3 % (ref 0–5)
Lymphs Abs: 2.5 10*3/uL (ref 0.7–4.0)
Monocytes Absolute: 0.7 10*3/uL (ref 0.1–1.0)
Monocytes Relative: 9 % (ref 3–12)

## 2011-09-05 LAB — CBC
MCH: 28.6 pg (ref 26.0–34.0)
MCV: 83.7 fL (ref 78.0–100.0)
Platelets: 140 10*3/uL — ABNORMAL LOW (ref 150–400)
RBC: 4.97 MIL/uL (ref 4.22–5.81)

## 2011-09-05 LAB — URINALYSIS, ROUTINE W REFLEX MICROSCOPIC
Leukocytes, UA: NEGATIVE
Nitrite: NEGATIVE
Specific Gravity, Urine: <1.005 — ABNORMAL LOW (ref 1.005–1.030)
Urobilinogen, UA: 0.2 mg/dL (ref 0.0–1.0)
pH: 7.5 (ref 5.0–8.0)

## 2011-09-05 MED ORDER — LORAZEPAM 1 MG PO TABS: 1.0000 mg | ORAL_TABLET | Freq: Three times a day (TID) | ORAL | Status: AC | PRN

## 2011-09-05 MED ORDER — LORAZEPAM 2 MG/ML IJ SOLN
1.0000 mg | Freq: Once | INTRAMUSCULAR | Status: AC
  Administered 2011-09-05: 1 mg via INTRAVENOUS
  Filled 2011-09-05: qty 1

## 2011-09-05 MED ORDER — SODIUM CHLORIDE 0.9 % IV BOLUS (SEPSIS)
1000.0000 mL | Freq: Once | INTRAVENOUS | Status: AC
  Administered 2011-09-05: 1000 mL via INTRAVENOUS

## 2011-09-05 MED ORDER — IOHEXOL 300 MG/ML  SOLN: 80.0000 mL | Freq: Once | INTRAMUSCULAR | Status: DC | PRN

## 2011-09-05 NOTE — ED Notes (Signed)
Patient denies si/hi at this time.  Family is at bedside and pt resting on stretcher.  NAD at this time.  Pt stated that he has thoughts of si/hi when he feels like he is "suffocating."

## 2011-09-05 NOTE — ED Provider Notes (Signed)
History   This chart was scribed for Benny Lennert, MD by Charolett Bumpers . The patient was seen in room [facility] and the patient's care was started at 3:30pm.  CSN: [id number]  Arrival date & time 09/05/11  1336   First MD Initiated Contact with Patient 09/05/11 1526      Chief Complaint  Patient presents with  . Dysphagia  . Respiratory Distress    (Consider location/radiation/quality/duration/timing/severity/associated sxs/prior treatment) HPI Bradley Brooks is a 66 y.o. male who presents to the Emergency Department complaining of constant, moderate SOB with associated dysphagia, increased urinary frequency and anxiety that started about 3 days ago. Patient reports that he tries to eat by "food is caught in my throat". Patient denies vomiting. Patient also reports that that his "lips feel funny", and his wife reports that his arm was numb yesterday. Patient reports no pain when swallowing. Patient notes having an inguinal hernia repair about 5 days ago with Dr. Leticia Penna. Patient reports no complications with surgery. Patient reports that prior to his surgery on Monday, he has never taken hydrocodone. Patient also reports that he is currently taking Aleve.    PCP: [facility]    Past Medical History  Diagnosis Date  . Complication of anesthesia     pt sts was very combative when coming out of anesthesia from hemorrhoids surgery. Did not do this with sebaceous cyst or either tcs    Past Surgical History  Procedure Date  . Hemorrhoid surgery 1995  . Irrigation and debridement sebaceous cyst     ziegler  . Inguinal hernia repair 08/31/2011    Procedure: HERNIA REPAIR INGUINAL ADULT;  Surgeon: Fabio Bering, MD;  Location: [facility];  Service: General;  Laterality: Left;    Family History  Problem Relation Age of Onset  . Anesthesia problems Neg Hx   . Hypotension Neg Hx   . Malignant hyperthermia Neg Hx   . Pseudochol deficiency Neg Hx     History  Substance  Use Topics  . Smoking status: Never Smoker   . Smokeless tobacco: Not on file  . Alcohol Use: No      Review of Systems  Constitutional: Negative for fatigue.  HENT: Negative for congestion, sinus pressure and ear discharge.   Eyes: Negative for discharge.  Respiratory: Positive for shortness of breath. Negative for cough.   Cardiovascular: Negative for chest pain.  Gastrointestinal: Negative for vomiting, abdominal pain and diarrhea.  Genitourinary: Positive for frequency (increased frequency). Negative for hematuria.  Musculoskeletal: Negative for back pain.  Skin: Negative for rash.  Neurological: Negative for seizures and headaches.  Hematological: Negative.   Psychiatric/Behavioral: Negative for hallucinations.       Anxiety    Allergies  Review of patient's allergies indicates no known allergies.  Home Medications   Current Outpatient Rx  Name Route Sig Dispense Refill  . DOCUSATE SODIUM 100 MG PO CAPS Oral Take 100 mg by mouth daily.    Marland Kitchen HYDROCODONE-ACETAMINOPHEN 5-325 MG PO TABS Oral Take 1-2 tablets by mouth every 4 (four) hours as needed for pain. 45 tablet 0  . IBUPROFEN 200 MG PO TABS Oral Take 200 mg by mouth every 6 (six) hours as needed. For pain      BP 138/82  Pulse 64  Temp(Src) 98.3 F (36.8 C) (Oral)  Resp 20  Ht 5\' 11"  (1.803 m)  Wt 213 lb (96.616 kg)  BMI 29.71 kg/m2  SpO2 99%  Physical Exam  Constitutional: He is  oriented to person, place, and time. He appears well-developed.  HENT:  Head: Normocephalic and atraumatic.  Eyes: Conjunctivae and EOM are normal. No scleral icterus.  Neck: Neck supple. No thyromegaly present.  Cardiovascular: Normal rate and regular rhythm.  Exam reveals no gallop and no friction rub.   No murmur heard. Pulmonary/Chest: No stridor. He has no wheezes. He has no rales. He exhibits no tenderness.  Abdominal: He exhibits no distension. There is no tenderness. There is no rebound.  Musculoskeletal: Normal range of  motion. He exhibits no edema.  Lymphadenopathy:    He has no cervical adenopathy.  Neurological: He is oriented to person, place, and time. Coordination normal.  Skin: No rash noted. No erythema.  Psychiatric: His behavior is normal. His mood appears anxious.    ED Course  Procedures (including critical care time)  DIAGNOSTIC STUDIES: Oxygen Saturation is 100% on room air, normal by my interpretation.    COORDINATION OF CARE:  1535: Attempted to get patient to drink water. Patient become anxious and was unable to drink due to SOB.  1545: Medications Ordered: Lorazepam injection 1 mg-once; Sodium chloride 0.9% bolus 1,000 mL-once.    Results for orders placed during the hospital encounter of 09/05/11  CBC      Component Value Range   WBC 7.8  4.0 - 10.5 (K/uL)   RBC 4.97  4.22 - 5.81 (MIL/uL)   Hemoglobin 14.2  13.0 - 17.0 (g/dL)   HCT 16.1  09.6 - 04.5 (%)   MCV 83.7  78.0 - 100.0 (fL)   MCH 28.6  26.0 - 34.0 (pg)   MCHC 34.1  30.0 - 36.0 (g/dL)   RDW 40.9  81.1 - 91.4 (%)   Platelets 140 (*) 150 - 400 (K/uL)  DIFFERENTIAL      Component Value Range   Neutrophils Relative 55  43 - 77 (%)   Neutro Abs 4.3  1.7 - 7.7 (K/uL)   Lymphocytes Relative 33  12 - 46 (%)   Lymphs Abs 2.5  0.7 - 4.0 (K/uL)   Monocytes Relative 9  3 - 12 (%)   Monocytes Absolute 0.7  0.1 - 1.0 (K/uL)   Eosinophils Relative 3  0 - 5 (%)   Eosinophils Absolute 0.2  0.0 - 0.7 (K/uL)   Basophils Relative 0  0 - 1 (%)   Basophils Absolute 0.0  0.0 - 0.1 (K/uL)  COMPREHENSIVE METABOLIC PANEL      Component Value Range   Sodium 136  135 - 145 (mEq/L)   Potassium 4.4  3.5 - 5.1 (mEq/L)   Chloride 100  96 - 112 (mEq/L)   CO2 28  19 - 32 (mEq/L)   Glucose, Bld 105 (*) 70 - 99 (mg/dL)   BUN 9  6 - 23 (mg/dL)   Creatinine, Ser 7.82  0.50 - 1.35 (mg/dL)   Calcium 95.6  8.4 - 10.5 (mg/dL)   Total Protein 7.4  6.0 - 8.3 (g/dL)   Albumin 4.0  3.5 - 5.2 (g/dL)   AST 17  0 - 37 (U/L)   ALT 22  0 - 53 (U/L)     Alkaline Phosphatase 77  39 - 117 (U/L)   Total Bilirubin 0.6  0.3 - 1.2 (mg/dL)   GFR calc non Af Amer 88 (*) >90 (mL/min)   GFR calc Af Amer >90  >90 (mL/min)  URINALYSIS, ROUTINE W REFLEX MICROSCOPIC      Component Value Range   Color, Urine STRAW (*) YELLOW  APPearance CLEAR  CLEAR    Specific Gravity, Urine <1.005 (*) 1.005 - 1.030    pH 7.5  5.0 - 8.0    Glucose, UA NEGATIVE  NEGATIVE (mg/dL)   Hgb urine dipstick NEGATIVE  NEGATIVE    Bilirubin Urine NEGATIVE  NEGATIVE    Ketones, ur NEGATIVE  NEGATIVE (mg/dL)   Protein, ur NEGATIVE  NEGATIVE (mg/dL)   Urobilinogen, UA 0.2  0.0 - 1.0 (mg/dL)   Nitrite NEGATIVE  NEGATIVE    Leukocytes, UA NEGATIVE  NEGATIVE    Dg Chest 2 View  09/03/2011  *RADIOLOGY REPORT*  Clinical Data: Shortness of breath  CHEST - 2 VIEW  Comparison: 02/18/2010  Findings: Lungs are clear. No pleural effusion or pneumothorax.  Cardiomediastinal silhouette is within normal limits.  Mild degenerative changes of the visualized thoracolumbar spine.  IMPRESSION: No evidence of acute cardiopulmonary disease.  Original Report Authenticated By: Charline Bills, M.D.   Ct Soft Tissue Neck W Contrast  09/05/2011  *RADIOLOGY REPORT*  Clinical Data: Hernia repair several days ago, now with difficulty swallowing food and liquid.  CT NECK WITH CONTRAST  Technique:  Multidetector CT imaging of the neck was performed with intravenous contrast.  Contrast:  Omnipaque-300, 75 ml  Comparison: None.  Findings: Suprahyoid neck:  No visible masses.  Major and minor salivary glands unremarkable.  Larynx:  Normal.  Infrahyoid neck:  No visible masses or esophageal dilatation.  Lymph nodes:  No pathologic lymph node abnormality is seen.  Upper chest/mediastinum:  Lung apices clear.  No air-fluid level in the esophagus.  No mediastinal masses.  Transverse arch great vessels unremarkable.  Additional:  Mild spondylosis. No visible occlusion or stenosis of the carotid bifurcations or  vertebral arteries.  Intracranial compartment unremarkable except for mild atrophy.  No osseous destructive lesion.  Visualized sinuses and mastoids clear.  IMPRESSION: Unremarkable CT neck.  No visible neck mass or esophageal dilatation.  Original Report Authenticated By: Elsie Stain, M.D.   Anxiety.    Pt improved with tx   No diagnosis found.    MDM    The chart was scribed for me under my direct supervision.  I personally performed the history, physical, and medical decision making and all procedures in the evaluation of this patient.Benny Lennert, MD 09/05/11 [phone]

## 2011-09-05 NOTE — ED Notes (Signed)
Pt pacing floor in room. Family at bedside

## 2011-09-05 NOTE — ED Notes (Signed)
Pt was seen here Thursday for SOB after eating. Pt now states he feels like "food is caught in my throat". Pt states he had Hernia Surgery on Monday and problems started after that. Pt also reports "my lips feel funny and my left arm feels funny". No neurological change from last visit. Grips, pushes, pulls all strong. VSS, NAD at this time.

## 2011-09-05 NOTE — ED Notes (Signed)
Pt states he "feels much better."  Pt resting on stretcher with family at bedside.

## 2011-09-05 NOTE — ED Notes (Signed)
Pt sates tingling  To left hand, numbness to forearm and hand yesterday. Numbness to both lips also. Pt states he last ate a banana this morning.

## 2013-06-14 ENCOUNTER — Encounter (INDEPENDENT_AMBULATORY_CARE_PROVIDER_SITE_OTHER): Payer: Self-pay | Admitting: *Deleted

## 2013-10-20 ENCOUNTER — Ambulatory Visit: Payer: BC Managed Care – PPO | Admitting: Urology

## 2013-10-31 ENCOUNTER — Encounter (INDEPENDENT_AMBULATORY_CARE_PROVIDER_SITE_OTHER): Payer: Self-pay | Admitting: *Deleted

## 2013-11-03 ENCOUNTER — Encounter (INDEPENDENT_AMBULATORY_CARE_PROVIDER_SITE_OTHER): Payer: Self-pay | Admitting: *Deleted

## 2013-11-03 ENCOUNTER — Other Ambulatory Visit (INDEPENDENT_AMBULATORY_CARE_PROVIDER_SITE_OTHER): Payer: Self-pay | Admitting: *Deleted

## 2013-11-03 ENCOUNTER — Telehealth (INDEPENDENT_AMBULATORY_CARE_PROVIDER_SITE_OTHER): Payer: Self-pay | Admitting: *Deleted

## 2013-11-03 ENCOUNTER — Encounter (INDEPENDENT_AMBULATORY_CARE_PROVIDER_SITE_OTHER): Payer: Self-pay

## 2013-11-03 DIAGNOSIS — Z1211 Encounter for screening for malignant neoplasm of colon: Secondary | ICD-10-CM

## 2013-11-03 NOTE — Telephone Encounter (Signed)
Patient needs movi prep 

## 2013-11-06 MED ORDER — PEG-KCL-NACL-NASULF-NA ASC-C 100 G PO SOLR: 1.0000 | Freq: Once | ORAL | Status: DC

## 2013-11-10 ENCOUNTER — Other Ambulatory Visit: Payer: Self-pay | Admitting: Orthopedic Surgery

## 2013-11-10 ENCOUNTER — Ambulatory Visit
Admission: RE | Admit: 2013-11-10 | Discharge: 2013-11-10 | Disposition: A | Discharge status: Home / Self Care | Payer: 59 | Source: Ambulatory Visit | Attending: Orthopedic Surgery | Admitting: Orthopedic Surgery

## 2013-11-10 DIAGNOSIS — M25561 Pain in right knee: Secondary | ICD-10-CM

## 2013-11-10 DIAGNOSIS — M7742 Metatarsalgia, left foot: Secondary | ICD-10-CM

## 2013-11-10 DIAGNOSIS — M7741 Metatarsalgia, right foot: Secondary | ICD-10-CM

## 2014-01-02 ENCOUNTER — Telehealth (INDEPENDENT_AMBULATORY_CARE_PROVIDER_SITE_OTHER): Payer: Self-pay | Admitting: *Deleted

## 2014-01-02 NOTE — Telephone Encounter (Signed)
  Procedure: tcs  Reason/Indication:  fam hx colon ca, screening  Has patient had this procedure before?  Yes, 2004 -- scanned  If so, when, by whom and where?    Is there a family history of colon cancer?  Yes, 2 brothers  Who?  What age when diagnosed?    Is patient diabetic?   no      Does patient have prosthetic heart valve?  no  Do you have a pacemaker?  no  Has patient ever had endocarditis? no  Has patient had joint replacement within last 12 months?  no  Does patient tend to be constipated or take laxatives? no  Is patient on Coumadin, Plavix and/or Aspirin? no  Medications: none  Allergies: nkda  Medication Adjustment:   Procedure date & time: 01/24/14 at 830

## 2014-01-02 NOTE — Telephone Encounter (Signed)
agree

## 2014-01-03 ENCOUNTER — Encounter (HOSPITAL_COMMUNITY): Payer: Self-pay | Admitting: Pharmacy Technician

## 2014-01-24 ENCOUNTER — Ambulatory Visit (HOSPITAL_COMMUNITY)
Admission: RE | Admit: 2014-01-24 | Discharge: 2014-01-24 | Disposition: A | Discharge status: Home / Self Care | Payer: Medicare Other | Source: Ambulatory Visit | Attending: Internal Medicine | Admitting: Internal Medicine

## 2014-01-24 ENCOUNTER — Encounter (HOSPITAL_COMMUNITY): Payer: Self-pay | Admitting: *Deleted

## 2014-01-24 ENCOUNTER — Encounter (HOSPITAL_COMMUNITY)
Admission: RE | Disposition: A | Discharge status: Home / Self Care | Payer: Self-pay | Source: Ambulatory Visit | Attending: Internal Medicine

## 2014-01-24 DIAGNOSIS — Z800 Family history of malignant neoplasm of digestive organs: Secondary | ICD-10-CM | POA: Insufficient documentation

## 2014-01-24 DIAGNOSIS — K644 Residual hemorrhoidal skin tags: Secondary | ICD-10-CM

## 2014-01-24 DIAGNOSIS — G589 Mononeuropathy, unspecified: Secondary | ICD-10-CM | POA: Insufficient documentation

## 2014-01-24 DIAGNOSIS — Z1211 Encounter for screening for malignant neoplasm of colon: Secondary | ICD-10-CM | POA: Insufficient documentation

## 2014-01-24 HISTORY — PX: COLONOSCOPY: SHX5424

## 2014-01-24 SURGERY — COLONOSCOPY
Anesthesia: Moderate Sedation

## 2014-01-24 MED ORDER — MEPERIDINE HCL 50 MG/ML IJ SOLN
INTRAMUSCULAR | Status: AC
  Filled 2014-01-24: qty 1

## 2014-01-24 MED ORDER — STERILE WATER FOR IRRIGATION IR SOLN
Status: DC | PRN
  Administered 2014-01-24: 09:00:00

## 2014-01-24 MED ORDER — MIDAZOLAM HCL 5 MG/5ML IJ SOLN
INTRAMUSCULAR | Status: AC
  Filled 2014-01-24: qty 10

## 2014-01-24 MED ORDER — SODIUM CHLORIDE 0.9 % IV SOLN
INTRAVENOUS | Status: DC
  Administered 2014-01-24: 1000 mL via INTRAVENOUS

## 2014-01-24 MED ORDER — MEPERIDINE HCL 50 MG/ML IJ SOLN
INTRAMUSCULAR | Status: DC | PRN
  Administered 2014-01-24 (×2): 25 mg via INTRAVENOUS

## 2014-01-24 MED ORDER — MIDAZOLAM HCL 5 MG/5ML IJ SOLN
INTRAMUSCULAR | Status: DC | PRN
  Administered 2014-01-24 (×2): 2 mg via INTRAVENOUS

## 2014-01-24 NOTE — Discharge Instructions (Signed)
Resume usual diet. No driving for 24 hours. Next screening exam in 5 years. Colonoscopy, Care After Refer to this sheet in the next few weeks. These instructions provide you with information on caring for yourself after your procedure. Your health care provider may also give you more specific instructions. Your treatment has been planned according to current medical practices, but problems sometimes occur. Call your health care provider if you have any problems or questions after your procedure. WHAT TO EXPECT AFTER THE PROCEDURE  After your procedure, it is typical to have the following:  A small amount of blood in your stool.  Moderate amounts of gas and mild abdominal cramping or bloating. HOME CARE INSTRUCTIONS  Do not drive, operate machinery, or sign important documents for 24 hours.  You may shower and resume your regular physical activities, but move at a slower pace for the first 24 hours.  Take frequent rest periods for the first 24 hours.  Walk around or put a warm pack on your abdomen to help reduce abdominal cramping and bloating.  Drink enough fluids to keep your urine clear or pale yellow.  You may resume your normal diet as instructed by your health care provider. Avoid heavy or fried foods that are hard to digest.  Avoid drinking alcohol for 24 hours or as instructed by your health care provider.  Only take over-the-counter or prescription medicines as directed by your health care provider.  If a tissue sample (biopsy) was taken during your procedure:  Do not take aspirin or blood thinners for 7 days, or as instructed by your health care provider.  Do not drink alcohol for 7 days, or as instructed by your health care provider.  Eat soft foods for the first 24 hours. SEEK MEDICAL CARE IF: You have persistent spotting of blood in your stool 2 3 days after the procedure. SEEK IMMEDIATE MEDICAL CARE IF:  You have more than a small spotting of blood in your  stool.  You pass large blood clots in your stool.  Your abdomen is swollen (distended).  You have nausea or vomiting.  You have a fever.  You have increasing abdominal pain that is not relieved with medicine. Document Released: 03/17/2004 Document Revised: 05/24/2013 Document Reviewed: 04/10/2013 [facility] Patient Information 2014 [place].

## 2014-01-24 NOTE — Op Note (Signed)
COLONOSCOPY PROCEDURE REPORT  PATIENT:  Bradley Brooks  MR#:  [id number] Birthdate:  03-May-1946, 68 y.o., male Endoscopist:  Dr. Rogene Houston, MD Referred By:  Dr. Sherrilee Gilles. Gerarda Fraction, MD Procedure Date: 01/24/2014  Procedure:   Colonoscopy  Indications:  Patient is 68 year old African American male who is here for high risk screening colonoscopy. Patient's last exam was in 2004. Family history significant for colon carcinoma in a sister and a brother.  Informed Consent:  The procedure and risks were reviewed with the patient and informed consent was obtained.  Medications:  Demerol 50 mg IV Versed 5 mg IV  Description of procedure:  After a digital rectal exam was performed, that colonoscope was advanced from the anus through the rectum and colon to the area of the cecum, ileocecal valve and appendiceal orifice. The cecum was deeply intubated. These structures were well-seen and photographed for the record. From the level of the cecum and ileocecal valve, the scope was slowly and cautiously withdrawn. The mucosal surfaces were carefully surveyed utilizing scope tip to flexion to facilitate fold flattening as needed. The scope was pulled down into the rectum where a thorough exam including retroflexion was performed.  Findings:   Prep excellent. Normal mucosa of the cecum, ascending colon, hepatic flexure, transverse colon, splenic flexure, descending and sigmoid colon as well as rectum. Small hemorrhoids below the dentate line.   Therapeutic/Diagnostic Maneuvers Performed:   None  Complications:  None  Cecal Withdrawal Time:  12 minutes  Impression:  Normal colonoscopy except small external hemorrhoids.  Recommendations:  Standard instructions given. Next screening exam in 5 years.  Kensly Bowmer U  01/24/2014 9:00 AM  CC: Dr. Glo Herring., MD & Dr. Rayne Du ref. provider found

## 2014-01-24 NOTE — H&P (Signed)
Bradley Brooks is an 68 y.o. male.   Chief Complaint: Patient is here for colonoscopy. HPI: Patient is 68 year old African American male who is here for screening colonoscopy. His last exam was in June 2004. He denies abdominal pain change in his bowel habits or rectal bleeding. Family history significant for colon carcinoma in brother and sister. He states they were in their 60s at the time of diagnosis. Family history is also positive for known GI malignancies but details not available.    Past Medical History  Diagnosis Date  . Complication of anesthesia     pt sts was very combative when coming out of anesthesia from hemorrhoids surgery. Did not do this with sebaceous cyst or either tcs  . Neuropathy     Past Surgical History  Procedure Laterality Date  . Hemorrhoid surgery  1995  . Irrigation and debridement sebaceous cyst      ziegler  . Inguinal hernia repair  08/31/2011    Procedure: HERNIA REPAIR INGUINAL ADULT;  Surgeon: Donato Heinz, MD;  Location: [facility];  Service: General;  Laterality: Left;    Family History  Problem Relation Age of Onset  . Anesthesia problems Neg Hx   . Hypotension Neg Hx   . Malignant hyperthermia Neg Hx   . Pseudochol deficiency Neg Hx   . Heart disease Mother   . Cancer Sister   . Cancer Brother   . Cancer Brother   . Cancer Brother   . Cancer Brother   . Cancer Brother   . Cancer Brother    Social History:  reports that he has never smoked. He does not have any smokeless tobacco history on file. He reports that he does not drink alcohol or use illicit drugs.  Allergies: No Known Allergies  Medications Prior to Admission  Medication Sig Dispense Refill  . peg 3350 powder (MOVIPREP) 100 G SOLR Take 1 kit (200 g total) by mouth once.  1 kit  0    No results found for this or any previous visit (from the past 48 hour(s)). No results found.  ROS  Blood pressure 154/80, pulse 52, temperature 97.5 F (36.4 C), temperature source  Oral, resp. rate 14, height 5' 11"  (1.803 m), weight 210 lb (95.255 kg), SpO2 98.00%. Physical Exam  Constitutional: He appears well-developed and well-nourished.  HENT:  Mouth/Throat: Oropharynx is clear and moist.  Eyes: Conjunctivae are normal. No scleral icterus.  Neck: No thyromegaly present.  Cardiovascular: Normal rate, regular rhythm and normal heart sounds.   No murmur heard. Respiratory: Effort normal and breath sounds normal.  GI: Soft. Bowel sounds are normal. He exhibits no distension and no mass. There is no tenderness.  Musculoskeletal: He exhibits no edema.  Lymphadenopathy:    He has no cervical adenopathy.  Neurological: He is alert.  Skin: Skin is warm and dry.     Assessment/Plan;  High risk screening colonoscopy.  REHMAN,NAJEEB U 01/24/2014, 8:33 AM

## 2014-01-25 ENCOUNTER — Encounter (HOSPITAL_COMMUNITY): Payer: Self-pay | Admitting: Internal Medicine

## 2014-09-24 ENCOUNTER — Telehealth: Payer: Self-pay | Admitting: Neurology

## 2014-09-24 NOTE — Telephone Encounter (Signed)
Pt canceled appt to see dr patel does not feel like he needs to come in

## 2014-10-12 ENCOUNTER — Ambulatory Visit: Payer: Self-pay | Admitting: Neurology

## 2015-08-10 ENCOUNTER — Encounter (HOSPITAL_COMMUNITY): Payer: Self-pay | Admitting: Emergency Medicine

## 2015-08-10 ENCOUNTER — Emergency Department (HOSPITAL_COMMUNITY)
Admission: EM | Admit: 2015-08-10 | Discharge: 2015-08-10 | Disposition: A | Discharge status: Home / Self Care | Payer: Medicare Other | Attending: Emergency Medicine | Admitting: Emergency Medicine

## 2015-08-10 DIAGNOSIS — F419 Anxiety disorder, unspecified: Secondary | ICD-10-CM | POA: Insufficient documentation

## 2015-08-10 DIAGNOSIS — F410 Panic disorder [episodic paroxysmal anxiety] without agoraphobia: Secondary | ICD-10-CM | POA: Diagnosis present

## 2015-08-10 DIAGNOSIS — Z8669 Personal history of other diseases of the nervous system and sense organs: Secondary | ICD-10-CM | POA: Diagnosis not present

## 2015-08-10 MED ORDER — LORAZEPAM 1 MG PO TABS: 1.0000 mg | ORAL_TABLET | Freq: Every day | ORAL | Status: DC | PRN

## 2015-08-10 MED ORDER — LORAZEPAM 1 MG PO TABS
1.0000 mg | ORAL_TABLET | Freq: Once | ORAL | Status: AC
  Administered 2015-08-10: 1 mg via ORAL
  Filled 2015-08-10: qty 1

## 2015-08-10 NOTE — ED Provider Notes (Signed)
CSN: [id number]     Arrival date & time 08/10/15  [id number] History   First MD Initiated Contact with Patient 08/10/15 1840     Chief Complaint  Patient presents with  . Panic Attack     (Consider location/radiation/quality/duration/timing/severity/associated sxs/prior Treatment) HPI Comments: The patient is a 69 year old male, he does have a history of neuropathy, he takes no other daily medications, he is usually in a good state of health however over the course of the day he has felt her aggressively agitated and uncomfortable, he is having difficulty describing what he is feeling but his wife states that he is starting to pace the house. He did this at a prior episode during what they both described as a panic attack, it was short-lived and went away and he has been doing very good for the last year. He does not have any history of depression, anxiety, schizophrenia and has no substance abuse history. The wife reports that last night they had a close friend of the family they lost their son in a shooting, they also report that they had a family member that they lost in a shooting a couple of years ago. The patient also states that he did take some decongestant medication earlier in the day as well. He denies chest pain, shortness of breath, sore throat, fever, diarrhea, swelling or any other complaints.   The history is provided by the patient and the spouse.    Past Medical History  Diagnosis Date  . Complication of anesthesia     pt sts was very combative when coming out of anesthesia from hemorrhoids surgery. Did not do this with sebaceous cyst or either tcs  . Neuropathy [facility])    Past Surgical History  Procedure Laterality Date  . Hemorrhoid surgery  1995  . Irrigation and debridement sebaceous cyst      ziegler  . Inguinal hernia repair  08/31/2011    Procedure: HERNIA REPAIR INGUINAL ADULT;  Surgeon: Donato Heinz, MD;  Location: [facility];  Service: General;  Laterality: Left;  .  Colonoscopy N/A 01/24/2014    Procedure: COLONOSCOPY;  Surgeon: Rogene Houston, MD;  Location: [facility];  Service: Endoscopy;  Laterality: N/A;  14   Family History  Problem Relation Age of Onset  . Anesthesia problems Neg Hx   . Hypotension Neg Hx   . Malignant hyperthermia Neg Hx   . Pseudochol deficiency Neg Hx   . Heart disease Mother   . Cancer Sister   . Cancer Brother   . Cancer Brother   . Cancer Brother   . Cancer Brother   . Cancer Brother   . Cancer Brother    Social History  Substance Use Topics  . Smoking status: Never Smoker   . Smokeless tobacco: Never Used  . Alcohol Use: No    Review of Systems  All other systems reviewed and are negative.     Allergies  Hydrocodone  Home Medications   Prior to Admission medications   Medication Sig Start Date End Date Taking? Authorizing Provider  LORazepam (ATIVAN) 1 MG tablet Take 1 tablet (1 mg total) by mouth daily as needed for anxiety. 08/10/15   Noemi Chapel, MD   BP 154/89 mmHg  Pulse 56  Temp(Src) 97.7 F (36.5 C) (Oral)  Resp 20  Ht 5\' 11"  (1.803 m)  Wt 215 lb (97.523 kg)  BMI 30.00 kg/m2  SpO2 100% Physical Exam  Constitutional: He appears well-developed and well-nourished. No distress.  HENT:  Head: Normocephalic and atraumatic.  Mouth/Throat: Oropharynx is clear and moist. No oropharyngeal exudate.  Eyes: Conjunctivae and EOM are normal. Pupils are equal, round, and reactive to light. Right eye exhibits no discharge. Left eye exhibits no discharge. No scleral icterus.  Neck: Normal range of motion. Neck supple. No JVD present. No thyromegaly present.  Cardiovascular: Normal rate, regular rhythm, normal heart sounds and intact distal pulses.  Exam reveals no gallop and no friction rub.   No murmur heard. Pulmonary/Chest: Effort normal and breath sounds normal. No respiratory distress. He has no wheezes. He has no rales.  Abdominal: Soft. Bowel sounds are normal. He exhibits no distension  and no mass. There is no tenderness.  Musculoskeletal: Normal range of motion. He exhibits no edema or tenderness.  Lymphadenopathy:    He has no cervical adenopathy.  Neurological: He is alert. Coordination normal.  Skin: Skin is warm and dry. No rash noted. No erythema.  Psychiatric: He has a normal mood and affect. His behavior is normal.  The patient appears happy, he does appear slightly restless but has normal thought process, there is no internal stimuli, he answers questions appropriately  Nursing note and vitals reviewed.   ED Course  Procedures (including critical care time) Labs Review Labs Reviewed - No data to display  Imaging Review No results found. I have personally reviewed and evaluated these images and lab results as part of my medical decision-making.    MDM   Final diagnoses:  Anxiety    There is no pressured speech, no flights of ideas, no hallucinations. Vital signs are unremarkable, the patient does have a reason to have some anxiety after a traumatic event last night. He also has had some decongestants today which may provide some stimulant type results. Counseled the patient against over-the-counter medication use, he will be given a single dose of Ativan and discharged home, he appears very stable, his wife will drive, I have encouraged outpatient follow-up with a therapist which they have both agreed with.  there is no indication for laboratory workup or psychiatric consultation this evening.    Noemi Chapel, MD 08/10/15 [phone]

## 2015-08-10 NOTE — ED Notes (Signed)
Pt reports anxiety attack that started yesterday.

## 2015-08-10 NOTE — Discharge Instructions (Signed)

## 2015-09-09 ENCOUNTER — Encounter: Payer: Self-pay | Admitting: Neurology

## 2015-09-09 ENCOUNTER — Ambulatory Visit (INDEPENDENT_AMBULATORY_CARE_PROVIDER_SITE_OTHER): Payer: Medicare Other | Admitting: Neurology

## 2015-09-09 VITALS — BP 121/73 | HR 63 | Ht 71.0 in | Wt 207.0 lb

## 2015-09-09 DIAGNOSIS — R202 Paresthesia of skin: Secondary | ICD-10-CM | POA: Diagnosis not present

## 2015-09-09 NOTE — Progress Notes (Signed)
PATIENT: Bradley Brooks DOB: [birth year]  Chief Complaint  Patient presents with  . Peripheral Neuropathy    He is here with his wife, Bradley Brooks. Reports numbness, tingling and heaviness in his bilateral feet. He has never taken any medication or had any testing for these symptoms that have been present for two years.     HISTORICAL  Bradley Brooks is a 70 years old right-handed male, accompanied by his wife Bradley Brooks, seen in refer by  his primary care physician Dr.Fusco for evaluation of bilateral feet paresthesia.  The initial symptoms was right knee pain in 2015, later he described bilateral feet heaviness sensation, feel like there were weight at both feet, bottom of his feet also felt constant numbness tingling, no pain, he has no trouble walking, he denies neck pain, low back pain, no bowel and bladder incontinence. He works as a Teacher, early years/pre,  I reviewed laboratory, December 2016, HDL 41, LDL 132 triglyceride 151, TSH is 0.958,  REVIEW OF SYSTEMS: Full 14 system review of systems performed and notable only for numbness ALLERGIES: Allergies  Allergen Reactions  . Hydrocodone Other (See Comments)    Changes mental status, becomes combative    HOME MEDICATIONS: Current Outpatient Prescriptions  Medication Sig Dispense Refill  . LORazepam (ATIVAN) 1 MG tablet Take 1 tablet (1 mg total) by mouth daily as needed for anxiety. 5 tablet 0   No current facility-administered medications for this visit.    PAST MEDICAL HISTORY: Past Medical History  Diagnosis Date  . Complication of anesthesia     pt sts was very combative when coming out of anesthesia from hemorrhoids surgery. Did not do this with sebaceous cyst or either tcs  . Neuropathy ([place])   . Anxiety     PAST SURGICAL HISTORY: Past Surgical History  Procedure Laterality Date  . Hemorrhoid surgery  1995  . Irrigation and debridement sebaceous cyst      ziegler  . Inguinal hernia repair  08/31/2011    Procedure: HERNIA  REPAIR INGUINAL ADULT;  Surgeon: Donato Heinz, MD;  Location: [facility];  Service: General;  Laterality: Left;  . Colonoscopy N/A 01/24/2014    Procedure: COLONOSCOPY;  Surgeon: Rogene Houston, MD;  Location: [facility];  Service: Endoscopy;  Laterality: N/A;  830    FAMILY HISTORY: Family History  Problem Relation Age of Onset  . Anesthesia problems Neg Hx   . Hypotension Neg Hx   . Malignant hyperthermia Neg Hx   . Pseudochol deficiency Neg Hx   . Heart disease Mother   . Cancer Sister   . Cancer Brother   . Cancer Brother   . Cancer Brother   . Cancer Brother   . Cancer Brother   . Cancer Brother     SOCIAL HISTORY:  Social History   Social History  . Marital Status: Married    Spouse Name: N/A  . Number of Children: 3  . Years of Education: 12   Occupational History  . Bus Driver    Social History Main Topics  . Smoking status: Never Smoker   . Smokeless tobacco: Never Used  . Alcohol Use: No  . Drug Use: No  . Sexual Activity: Yes    Birth Control/ Protection: None   Other Topics Concern  . Not on file   Social History Narrative   Lives at home with his wife.   Right-handed.   No caffeine use.     PHYSICAL EXAM  Filed Vitals:   09/09/15 0933  BP: 121/73  Pulse: 63  Height: 5\' 11"  (1.803 m)  Weight: 207 lb (93.895 kg)    Not recorded      Body mass index is 28.88 kg/(m^2).  PHYSICAL EXAMNIATION:  Gen: NAD, conversant, well nourised, obese, well groomed                     Cardiovascular: Regular rate rhythm, no peripheral edema, warm, nontender. Eyes: Conjunctivae clear without exudates or hemorrhage Neck: Supple, no carotid bruise. Pulmonary: Clear to auscultation bilaterally   NEUROLOGICAL EXAM:  MENTAL STATUS: Speech:    Speech is normal; fluent and spontaneous with normal comprehension.  Cognition:     Orientation to time, place and person     Normal recent and remote memory     Normal Attention span and concentration      Normal Language, naming, repeating,spontaneous speech     Fund of knowledge   CRANIAL NERVES: CN II: Visual fields are full to confrontation. Fundoscopic exam is normal with sharp discs and no vascular changes. Pupils are round equal and briskly reactive to light. CN III, IV, VI: extraocular movement are normal. No ptosis. CN V: Facial sensation is intact to pinprick in all 3 divisions bilaterally. Corneal responses are intact.  CN VII: Face is symmetric with normal eye closure and smile. CN VIII: Hearing is normal to rubbing fingers CN IX, X: Palate elevates symmetrically. Phonation is normal. CN XI: Head turning and shoulder shrug are intact CN XII: Tongue is midline with normal movements and no atrophy.  MOTOR: There is no pronator drift of out-stretched arms. Muscle bulk and tone are normal. Muscle strength is normal.  REFLEXES: Reflexes are 2+ and symmetric at the biceps, triceps, knees, and ankles. Plantar responses are flexor.  SENSORY: Intact to light touch, pinprick, position sense, and vibration sense are intact in fingers and toes.  COORDINATION: Rapid alternating movements and fine finger movements are intact. There is no dysmetria on finger-to-nose and heel-knee-shin.    GAIT/STANCE: Posture is normal. Gait is steady with normal steps, base, arm swing, and turning. Heel and toe walking are normal. Tandem gait is normal.  Romberg is absent.   DIAGNOSTIC DATA (LABS, IMAGING, TESTING) - I reviewed patient records, labs, notes, testing and imaging myself where available.   ASSESSMENT AND PLAN  Bradley Brooks is a 70 y.o. male    Bilateral feet paresthesia  Differentiation diagnosis including small fiber neuropathy, lumbosacral radiculopathy  Proceed with EMG nerve conduction study  Laboratory evaluation today    Marcial Pacas, M.D. Ph.D.  [facility] [address] [place],  [address] Ph: [phone] Fax:  [phone]  [id number] Gerarda Fraction, MD

## 2015-09-10 LAB — COMPREHENSIVE METABOLIC PANEL
ALBUMIN: 4.6 g/dL (ref 3.5–4.8)
ALT: 21 IU/L (ref 0–44)
AST: 17 IU/L (ref 0–40)
Albumin/Globulin Ratio: 2.1 (ref 1.1–2.5)
Alkaline Phosphatase: 60 IU/L (ref 39–117)
BUN/Creatinine Ratio: 16 (ref 10–22)
BUN: 16 mg/dL (ref 8–27)
Bilirubin Total: 0.4 mg/dL (ref 0.0–1.2)
CO2: 22 mmol/L (ref 18–29)
CREATININE: 0.98 mg/dL (ref 0.76–1.27)
Calcium: 9.3 mg/dL (ref 8.6–10.2)
Chloride: 103 mmol/L (ref 96–106)
GFR, EST AFRICAN AMERICAN: 89 mL/min/{1.73_m2} (ref >59)
GFR, EST NON AFRICAN AMERICAN: 77 mL/min/{1.73_m2} (ref >59)
Globulin, Total: 2.2 g/dL (ref 1.5–4.5)
Glucose: 99 mg/dL (ref 65–99)
Potassium: 4.5 mmol/L (ref 3.5–5.2)
Sodium: 139 mmol/L (ref 134–144)
TOTAL PROTEIN: 6.8 g/dL (ref 6.0–8.5)

## 2015-09-10 LAB — VITAMIN B12: VITAMIN B 12: 502 pg/mL (ref 211–946)

## 2015-09-10 LAB — CBC
HEMATOCRIT: 43.9 % (ref 37.5–51.0)
HEMOGLOBIN: 15 g/dL (ref 12.6–17.7)
MCH: 28.5 pg (ref 26.6–33.0)
MCHC: 34.2 g/dL (ref 31.5–35.7)
MCV: 83 fL (ref 79–97)
Platelets: 151 10*3/uL (ref 150–379)
RBC: 5.27 x10E6/uL (ref 4.14–5.80)
RDW: 13.3 % (ref 12.3–15.4)
WBC: 4.7 10*3/uL (ref 3.4–10.8)

## 2015-09-10 LAB — RPR: RPR Ser Ql: NONREACTIVE

## 2015-09-10 LAB — ANA W/REFLEX IF POSITIVE: Anti Nuclear Antibody(ANA): NEGATIVE

## 2015-09-10 LAB — SEDIMENTATION RATE: Sed Rate: 4 mm/hr (ref 0–30)

## 2015-09-10 LAB — HGB A1C W/O EAG: Hgb A1c MFr Bld: 6.1 % — ABNORMAL HIGH (ref 4.8–5.6)

## 2015-09-10 LAB — FOLATE: FOLATE: 14.3 ng/mL (ref >3.0)

## 2015-09-10 LAB — C-REACTIVE PROTEIN: CRP: 1.5 mg/L (ref 0.0–4.9)

## 2015-10-16 ENCOUNTER — Ambulatory Visit (INDEPENDENT_AMBULATORY_CARE_PROVIDER_SITE_OTHER): Payer: Self-pay | Admitting: Neurology

## 2015-10-16 ENCOUNTER — Ambulatory Visit (INDEPENDENT_AMBULATORY_CARE_PROVIDER_SITE_OTHER): Payer: Medicare Other | Admitting: Neurology

## 2015-10-16 DIAGNOSIS — M79606 Pain in leg, unspecified: Secondary | ICD-10-CM | POA: Insufficient documentation

## 2015-10-16 DIAGNOSIS — R202 Paresthesia of skin: Secondary | ICD-10-CM | POA: Diagnosis not present

## 2015-10-16 DIAGNOSIS — Z0289 Encounter for other administrative examinations: Secondary | ICD-10-CM

## 2015-10-16 NOTE — Progress Notes (Signed)
EMG nerve conduction study today showed evidence of mild chronic bilateral lumbosacral radiculopathy, there was no evidence of large fiber peripheral neuropathy,  I have ordered MRI of lumbar spine without contrast

## 2015-10-16 NOTE — Procedures (Signed)
   NCS (NERVE CONDUCTION STUDY) WITH EMG (ELECTROMYOGRAPHY) REPORT   STUDY DATE: October 16 2015 PATIENT NAME: Bradley Brooks DOB: [birth year] MRN: [id number]    TECHNOLOGIST: Laretta Alstrom ELECTROMYOGRAPHER: Marcial Pacas M.D.  CLINICAL INFORMATION:  70 year old male with bilateral feet paresthesia, he denies gait difficulty, no low back pain.  FINDINGS: NERVE CONDUCTION STUDY: Bilateral peroneal sensory responses were normal. Bilateral median, plantar responses were unobtainable.  Bilateral peroneal to EDB and tibial motor responses were normal.  Bilateral tibial H reflexes were normal and symmetric.  NEEDLE ELECTROMYOGRAPHY: Selective needle examinations were performed at bilateral lower extremity muscles, bilateral lumbar sacral paraspinal muscles.  Bilateral tibialis anterior, peroneal longus: Increased insertional activity, no spontaneous activity, enlarged complex motor unit potential, with mildly decreased recruitment patterns.  Bilateral tibialis posterior, medial gastrocnemius, vastus lateralis: normal insertion activity, no spontaneous activity, mixture of normal, and some enlarged motor unit potential, with mildly decreased recruitment patterns.  Right biceps femoris long head: Normal insertion activity, no spontaneous activity, mixture of normal and some enlarged motor unit potential with mildly decreased recruitment patterns.  Right abductor hallucis longus: Increased insertional activity, no spontaneous activity, enlarged complex motor unit potential, with mildly decreased recruitment patterns  Bilateral lumbar sacral paraspinal muscles: Increased insertional activity,  complex enlarged motor unit potential was noticed at bilateral L4, L5, S1 paraspinal muscles, there was 1+ spontaneous activities at right S1 paraspinal muscles.  IMPRESSION:   This is an abnormal study. There is electrodiagnostic evidence of mild chronic lumbosacral radiculopathy, involving bilateral L4-5  S1 myotomes. There is no evidence of large fiber peripheral neuropathy.   INTERPRETING PHYSICIAN:   Marcial Pacas M.D. Ph.D. [facility] [address] [place], [place] [address] [phone]

## 2015-10-27 ENCOUNTER — Ambulatory Visit
Admission: RE | Admit: 2015-10-27 | Discharge: 2015-10-27 | Disposition: A | Discharge status: Home / Self Care | Payer: Medicare Other | Source: Ambulatory Visit | Attending: Neurology | Admitting: Neurology

## 2015-10-27 DIAGNOSIS — M79606 Pain in leg, unspecified: Secondary | ICD-10-CM | POA: Diagnosis not present

## 2015-10-27 DIAGNOSIS — R202 Paresthesia of skin: Secondary | ICD-10-CM | POA: Diagnosis not present

## 2015-10-28 ENCOUNTER — Telehealth: Payer: Self-pay | Admitting: Neurology

## 2015-10-28 NOTE — Telephone Encounter (Signed)
Spoke to his wife (on HIPPA) - aware of results and will keep his follow up appt.

## 2015-10-28 NOTE — Telephone Encounter (Signed)
Please call patient, MRI of lumbar showed multilevel degenerative changes, most severe at L4-5, but there was no evidence of canal stenosis, or significant nerve root impingement.  IMPRESSION: This MRI of the lumbar spine without contrast shows the following: 1. At L3-L4, there is facet hypertrophy and mild disc bulging that does not lead to any nerve root impingement. 2. At L4-L5, there is very minimal anterolisthesis, moderately severe facet hypertrophy and mild disc bulging. There does not appear to be any nerve root impingement. 3. At L5-S1 there are mild degenerative disc changes and moderate facet hypertrophy that do not lead to any nerve root impingement.

## 2015-11-12 ENCOUNTER — Encounter: Payer: Self-pay | Admitting: Neurology

## 2015-11-12 ENCOUNTER — Ambulatory Visit (INDEPENDENT_AMBULATORY_CARE_PROVIDER_SITE_OTHER): Payer: Medicare Other | Admitting: Neurology

## 2015-11-12 VITALS — BP 142/76 | HR 60 | Ht 71.0 in | Wt 207.0 lb

## 2015-11-12 DIAGNOSIS — R202 Paresthesia of skin: Secondary | ICD-10-CM

## 2015-11-12 NOTE — Progress Notes (Signed)
Chief Complaint  Patient presents with  . Peripheral Neuropathy    He is here with his wife, Bradley Brooks, to discuss his NCV/EMG and MRI results.      PATIENT: Bradley Brooks DOB: 03/10/1946  Chief Complaint  Patient presents with  . Peripheral Neuropathy    He is here with his wife, Bradley Brooks, to discuss his NCV/EMG and MRI results.     HISTORICAL  Bradley Brooks is a 70 years old right-handed male, accompanied by his wife Bradley Brooks, seen in refer by  his primary care physician Dr.Fusco for evaluation of bilateral feet paresthesia.  The initial symptoms was right knee pain in 2015, later he described bilateral feet heaviness sensation, feel like there were weight at both feet, bottom of his feet also felt constant numbness tingling, no pain, he has no trouble walking, he denies neck pain, low back pain, no bowel and bladder incontinence. He works as a Teacher, early years/pre,  I reviewed laboratory, December 2016, HDL 41, LDL 132 triglyceride 151, TSH is 0.958,  UPDATE March 28th 2017: I personally reviewed MRI of the lumbar spine in March  2017: 1. At L3-L4, there is facet hypertrophy and mild disc bulging that does not lead to any nerve root impingement.  At L4-L5, there is very minimal anterolisthesis, moderately severe facet hypertrophy and mild disc bulging. There does not appear to be any nerve root impingement.   At L5-S1 there are mild degenerative disc changes and moderate facet hypertrophy that do not lead to any nerve root impingement.  EMG nerve conduction study showed no evidence of large fiber peripheral neuropathy,  Laboratory evaluation showed mild elevated A1c 6.1, rest of the laboratory evaluations were normal or negative, I43, RPR, folic acid, ESR, C-reactive protein, TSH, CMP, CBC,   REVIEW OF SYSTEMS: Full 14 system review of systems performed and notable only for numbness ALLERGIES: Allergies  Allergen Reactions  . Hydrocodone Other (See Comments)    Changes mental status, becomes  combative    HOME MEDICATIONS: Current Outpatient Prescriptions  Medication Sig Dispense Refill  . LORazepam (ATIVAN) 1 MG tablet Take 1 tablet (1 mg total) by mouth daily as needed for anxiety. 5 tablet 0   No current facility-administered medications for this visit.    PAST MEDICAL HISTORY: Past Medical History  Diagnosis Date  . Complication of anesthesia     pt sts was very combative when coming out of anesthesia from hemorrhoids surgery. Did not do this with sebaceous cyst or either tcs  . Neuropathy ([place])   . Anxiety     PAST SURGICAL HISTORY: Past Surgical History  Procedure Laterality Date  . Hemorrhoid surgery  1995  . Irrigation and debridement sebaceous cyst      [facility]  . Inguinal hernia repair  08/31/2011    Procedure: HERNIA REPAIR INGUINAL ADULT;  Surgeon: Donato Heinz, MD;  Location: [facility];  Service: General;  Laterality: Left;  . Colonoscopy N/A 01/24/2014    Procedure: COLONOSCOPY;  Surgeon: Rogene Houston, MD;  Location: [facility];  Service: Endoscopy;  Laterality: N/A;  830    FAMILY HISTORY: Family History  Problem Relation Age of Onset  . Anesthesia problems Neg Hx   . Hypotension Neg Hx   . Malignant hyperthermia Neg Hx   . Pseudochol deficiency Neg Hx   . Heart disease Mother   . Cancer Sister   . Cancer Brother   . Cancer Brother   . Cancer Brother   . Cancer  Brother   . Cancer Brother   . Cancer Brother     SOCIAL HISTORY:  Social History   Social History  . Marital Status: Married    Spouse Name: N/A  . Number of Children: 3  . Years of Education: 12   Occupational History  . Bus Driver    Social History Main Topics  . Smoking status: Never Smoker   . Smokeless tobacco: Never Used  . Alcohol Use: No  . Drug Use: No  . Sexual Activity: Yes    Birth Control/ Protection: None   Other Topics Concern  . Not on file   Social History Narrative   Lives at home with his wife.   Right-handed.   No caffeine use.      PHYSICAL EXAM   Filed Vitals:   11/12/15 1134  BP: 142/76  Pulse: 60  Height: _0  (1.803 m)  Weight: 207 lb (93.895 kg)    Not recorded      Body mass index is 28.88 kg/(m^2).  PHYSICAL EXAMNIATION:  Gen: NAD, conversant, well nourised, obese, well groomed                     Cardiovascular: Regular rate rhythm, no peripheral edema, warm, nontender. Eyes: Conjunctivae clear without exudates or hemorrhage Neck: Supple, no carotid bruise. Pulmonary: Clear to auscultation bilaterally   NEUROLOGICAL EXAM:  MENTAL STATUS: Speech:    Speech is normal; fluent and spontaneous with normal comprehension.  Cognition:     Orientation to time, place and person     Normal recent and remote memory     Normal Attention span and concentration     Normal Language, naming, repeating,spontaneous speech     Fund of knowledge   CRANIAL NERVES: CN II: Visual fields are full to confrontation. Fundoscopic exam is normal with sharp discs and no vascular changes. Pupils are round equal and briskly reactive to light. CN III, IV, VI: extraocular movement are normal. No ptosis. CN V: Facial sensation is intact to pinprick in all 3 divisions bilaterally. Corneal responses are intact.  CN VII: Face is symmetric with normal eye closure and smile. CN VIII: Hearing is normal to rubbing fingers CN IX, X: Palate elevates symmetrically. Phonation is normal. CN XI: Head turning and shoulder shrug are intact CN XII: Tongue is midline with normal movements and no atrophy.  MOTOR: There is no pronator drift of out-stretched arms. Muscle bulk and tone are normal. Muscle strength is normal.  REFLEXES: Reflexes are 2+ and symmetric at the biceps, triceps, knees, and ankles. Plantar responses are flexor.  SENSORY: Intact to light touch, pinprick, position sense, and vibration sense are intact in fingers and toes.  COORDINATION: Rapid alternating movements and fine finger movements are intact.  There is no dysmetria on finger-to-nose and heel-knee-shin.    GAIT/STANCE: Posture is normal. Gait is steady with normal steps, base, arm swing, and turning. Heel and toe walking are normal. Tandem gait is normal.  Romberg is absent.   DIAGNOSTIC DATA (LABS, IMAGING, TESTING) - I reviewed patient records, labs, notes, testing and imaging myself where available.   ASSESSMENT AND PLAN  LIZANDRO SPELLMAN is a 70 y.o. 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 Gerarda Fraction,  he will continue follow-up  with him,  We have personally reviewed MRI of lumbar, mild degenerative changes, no evidence of canal or foraminal stenosis   Marcial Pacas, M.D. Ph.D.  [facility] [address], [place], [place] [address] Ph: [phone] Fax: [phone]  ER:[id number] Gerarda Fraction, MD

## 2016-07-09 ENCOUNTER — Emergency Department (HOSPITAL_COMMUNITY): Payer: No Typology Code available for payment source

## 2016-07-09 ENCOUNTER — Encounter (HOSPITAL_COMMUNITY): Payer: Self-pay

## 2016-07-09 ENCOUNTER — Emergency Department (HOSPITAL_COMMUNITY)
Admission: EM | Admit: 2016-07-09 | Discharge: 2016-07-09 | Disposition: A | Discharge status: Home / Self Care | Payer: No Typology Code available for payment source | Attending: Emergency Medicine | Admitting: Emergency Medicine

## 2016-07-09 DIAGNOSIS — S39012A Strain of muscle, fascia and tendon of lower back, initial encounter: Secondary | ICD-10-CM | POA: Diagnosis not present

## 2016-07-09 DIAGNOSIS — Y999 Unspecified external cause status: Secondary | ICD-10-CM | POA: Diagnosis not present

## 2016-07-09 DIAGNOSIS — S3992XA Unspecified injury of lower back, initial encounter: Secondary | ICD-10-CM | POA: Diagnosis present

## 2016-07-09 DIAGNOSIS — Z79899 Other long term (current) drug therapy: Secondary | ICD-10-CM | POA: Diagnosis not present

## 2016-07-09 DIAGNOSIS — Y92410 Unspecified street and highway as the place of occurrence of the external cause: Secondary | ICD-10-CM | POA: Diagnosis not present

## 2016-07-09 DIAGNOSIS — S8001XA Contusion of right knee, initial encounter: Secondary | ICD-10-CM | POA: Diagnosis not present

## 2016-07-09 DIAGNOSIS — Y9389 Activity, other specified: Secondary | ICD-10-CM | POA: Insufficient documentation

## 2016-07-09 MED ORDER — IBUPROFEN 800 MG PO TABS: 800.0000 mg | ORAL_TABLET | Freq: Three times a day (TID) | ORAL | 0 refills | Status: DC | PRN
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
## 2016-07-09 NOTE — Discharge Instructions (Signed)
Follow up with your md if not improving. °
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
## 2016-09-04 ENCOUNTER — Ambulatory Visit (INDEPENDENT_AMBULATORY_CARE_PROVIDER_SITE_OTHER): Payer: Medicare Other | Admitting: Urology

## 2016-09-04 ENCOUNTER — Other Ambulatory Visit: Payer: Self-pay | Admitting: Urology

## 2016-09-04 DIAGNOSIS — R972 Elevated prostate specific antigen [PSA]: Secondary | ICD-10-CM

## 2016-10-02 ENCOUNTER — Ambulatory Visit (HOSPITAL_COMMUNITY)
Admission: RE | Admit: 2016-10-02 | Discharge: 2016-10-02 | Disposition: A | Discharge status: Home / Self Care | Payer: Medicare Other | Source: Ambulatory Visit | Attending: Urology | Admitting: Urology

## 2016-10-02 ENCOUNTER — Encounter (HOSPITAL_COMMUNITY): Payer: Self-pay

## 2016-10-02 DIAGNOSIS — R972 Elevated prostate specific antigen [PSA]: Secondary | ICD-10-CM

## 2016-10-02 MED ORDER — CEFTRIAXONE SODIUM 1 G IJ SOLR: 1.0000 g | Freq: Once | INTRAMUSCULAR | Status: DC

## 2016-10-02 MED ORDER — CEFTRIAXONE SODIUM 1 G IJ SOLR
INTRAMUSCULAR | Status: AC
  Filled 2016-10-02: qty 10

## 2016-10-02 MED ORDER — LIDOCAINE HCL (PF) 2 % IJ SOLN
INTRAMUSCULAR | Status: AC
  Filled 2016-10-02: qty 10

## 2016-10-09 ENCOUNTER — Ambulatory Visit (HOSPITAL_COMMUNITY): Admission: RE | Admit: 2016-10-09 | Payer: Medicare Other | Source: Ambulatory Visit

## 2016-10-09 ENCOUNTER — Ambulatory Visit (HOSPITAL_COMMUNITY)
Admission: RE | Admit: 2016-10-09 | Discharge: 2016-10-09 | Disposition: A | Discharge status: Home / Self Care | Payer: Medicare Other | Source: Ambulatory Visit | Attending: Urology | Admitting: Urology

## 2016-10-09 DIAGNOSIS — R972 Elevated prostate specific antigen [PSA]: Secondary | ICD-10-CM

## 2016-10-09 DIAGNOSIS — C61 Malignant neoplasm of prostate: Secondary | ICD-10-CM | POA: Insufficient documentation

## 2016-10-09 MED ORDER — LIDOCAINE HCL (PF) 2 % IJ SOLN
10.0000 mL | Freq: Once | INTRAMUSCULAR | Status: AC
  Administered 2016-10-09: 10 mL

## 2016-10-09 MED ORDER — LIDOCAINE HCL (PF) 2 % IJ SOLN
INTRAMUSCULAR | Status: AC
  Administered 2016-10-09: 10 mL
  Filled 2016-10-09: qty 10

## 2016-10-09 MED ORDER — CEFTRIAXONE SODIUM 1 G IJ SOLR
INTRAMUSCULAR | Status: AC
  Administered 2016-10-09: 1 g via INTRAMUSCULAR
  Filled 2016-10-09: qty 10

## 2016-10-09 MED ORDER — CEFTRIAXONE SODIUM 1 G IJ SOLR
1.0000 g | Freq: Once | INTRAMUSCULAR | Status: AC
  Administered 2016-10-09: 1 g via INTRAMUSCULAR

## 2016-10-09 MED ORDER — LIDOCAINE HCL (PF) 1 % IJ SOLN
INTRAMUSCULAR | Status: AC
  Administered 2016-10-09: 2.1 mL
  Filled 2016-10-09: qty 5

## 2016-10-09 NOTE — Discharge Instructions (Signed)
Transrectal Ultrasound-Guided Biopsy °A transrectal ultrasound-guided biopsy is a procedure to take samples of tissue from your prostate. Ultrasound images are used to guide the procedure. It is usually done to check the prostate gland for cancer. °What happens before the procedure? °· Do not eat or drink after midnight on the night before your procedure. °· Take medicines as your doctor tells you. °· Your doctor may have you stop taking some medicines 5-7 days before the procedure. °· You will be given an enema before your procedure. During an enema, a liquid is put into your butt (rectum) to clear out waste. °· You may have lab tests the day of your procedure. °· Make plans to have someone drive you home. °What happens during the procedure? °· You will be given medicine to help you relax before the procedure. An IV tube will be put into one of your veins. It will be used to give fluids and medicine. °· You will be given medicine to reduce the risk of infection (antibiotic). °· You will be placed on your side. °· A probe with gel will be put in your butt. This is used to take pictures of your prostate and the area around it. °· A medicine to numb the area is put into your prostate. °· A biopsy needle is then inserted and guided to your prostate. °· Samples of prostate tissue are taken. The needle is removed. °· The samples are sent to a lab to be checked. Results are usually back in 2-3 days. °What happens after the procedure? °· You will be taken to a room where you will be watched until you are doing okay. °· You may have some pain in the area around your butt. You will be given medicines for this. °· You may be able to go home the same day. Sometimes, an overnight stay in the hospital is needed. °This information is not intended to replace advice given to you by your health care provider. Make sure you discuss any questions you have with your health care provider. °Document Released: 07/22/2009 Document Revised:  01/09/2016 Document Reviewed: 03/22/2013 °Elsevier Interactive Patient Education © 2017 [employer] ° °

## 2016-10-30 ENCOUNTER — Institutional Professional Consult (permissible substitution) (INDEPENDENT_AMBULATORY_CARE_PROVIDER_SITE_OTHER): Payer: Medicare Other | Admitting: Urology

## 2016-10-30 DIAGNOSIS — C61 Malignant neoplasm of prostate: Secondary | ICD-10-CM

## 2016-11-02 ENCOUNTER — Encounter: Payer: Self-pay | Admitting: Radiation Oncology

## 2016-11-06 ENCOUNTER — Ambulatory Visit (HOSPITAL_COMMUNITY)
Admission: RE | Admit: 2016-11-06 | Discharge: 2016-11-06 | Disposition: A | Discharge status: Home / Self Care | Payer: Medicare Other | Source: Ambulatory Visit | Attending: Registered Nurse | Admitting: Registered Nurse

## 2016-11-06 ENCOUNTER — Other Ambulatory Visit (HOSPITAL_COMMUNITY): Payer: Self-pay | Admitting: Registered Nurse

## 2016-11-06 DIAGNOSIS — N50819 Testicular pain, unspecified: Secondary | ICD-10-CM

## 2016-11-06 DIAGNOSIS — N4889 Other specified disorders of penis: Secondary | ICD-10-CM

## 2016-11-06 DIAGNOSIS — N433 Hydrocele, unspecified: Secondary | ICD-10-CM | POA: Insufficient documentation

## 2016-11-06 DIAGNOSIS — N503 Cyst of epididymis: Secondary | ICD-10-CM | POA: Diagnosis not present

## 2016-11-24 ENCOUNTER — Encounter: Payer: Self-pay | Admitting: Radiation Oncology
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
## 2016-11-25 ENCOUNTER — Ambulatory Visit
Admission: RE | Admit: 2016-11-25 | Discharge: 2016-11-25 | Disposition: A | Discharge status: Home / Self Care | Payer: Medicare Other | Source: Ambulatory Visit | Attending: Radiation Oncology | Admitting: Radiation Oncology

## 2016-11-25 ENCOUNTER — Encounter: Payer: Self-pay | Admitting: Radiation Oncology

## 2016-11-25 VITALS — BP 156/87 | HR 52 | Resp 16 | Wt 215.2 lb

## 2016-11-25 DIAGNOSIS — I350 Nonrheumatic aortic (valve) stenosis: Secondary | ICD-10-CM | POA: Diagnosis not present

## 2016-11-25 DIAGNOSIS — R300 Dysuria: Secondary | ICD-10-CM | POA: Insufficient documentation

## 2016-11-25 DIAGNOSIS — E7800 Pure hypercholesterolemia, unspecified: Secondary | ICD-10-CM | POA: Diagnosis not present

## 2016-11-25 DIAGNOSIS — N503 Cyst of epididymis: Secondary | ICD-10-CM | POA: Diagnosis not present

## 2016-11-25 DIAGNOSIS — C61 Malignant neoplasm of prostate: Secondary | ICD-10-CM | POA: Insufficient documentation

## 2016-11-25 DIAGNOSIS — N433 Hydrocele, unspecified: Secondary | ICD-10-CM | POA: Diagnosis not present

## 2016-11-25 DIAGNOSIS — N5089 Other specified disorders of the male genital organs: Secondary | ICD-10-CM | POA: Diagnosis not present

## 2016-11-25 DIAGNOSIS — F419 Anxiety disorder, unspecified: Secondary | ICD-10-CM | POA: Insufficient documentation

## 2016-11-25 HISTORY — DX: Malignant neoplasm of prostate: C61

## 2016-11-25 HISTORY — DX: Unspecified osteoarthritis, unspecified site: M19.90

## 2016-11-25 NOTE — Progress Notes (Signed)
See progress note under physician encounter. 
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
## 2016-11-26 ENCOUNTER — Other Ambulatory Visit: Payer: Self-pay | Admitting: Urology

## 2016-11-27 ENCOUNTER — Telehealth: Payer: Self-pay | Admitting: *Deleted

## 2016-11-27 NOTE — Telephone Encounter (Signed)
Called patient to inform of pre-seed appt. on May 3 and his implant on July 5, spoke with patient's wife - [name] and she is aware of these appts.

## 2016-12-15 ENCOUNTER — Telehealth: Payer: Self-pay | Admitting: Medical Oncology

## 2016-12-15 NOTE — Progress Notes (Signed)
  Radiation Oncology         ([phone]) [phone] ________________________________  Name: SHAMARI LOFQUIST MRN: [id number]  Date: 12/17/2016  DOB: May 09, 1946  SIMULATION AND TREATMENT PLANNING NOTE PUBIC ARCH STUDY  [id number] Elenore Rota, MD  Irine Seal, MD  DIAGNOSIS: 71 year-old gentleman with Stage T1c adenocarcinoma of the prostate with Gleason Score of 3+4, and PSA of 5.4.      ICD-9-CM ICD-10-CM   1. Prostate cancer ([place]) [place]:  The patient presented today for evaluation for possible prostate seed implant. He was brought to the radiation planning suite and placed supine on the CT couch. A 3-dimensional image study set was obtained in upload to the planning computer. There, on each axial slice, I contoured the prostate gland. Then, using three-dimensional radiation planning tools I reconstructed the prostate in view of the structures from the transperineal needle pathway to assess for possible pubic arch interference. In doing so, I did not appreciate any pubic arch interference. Also, the patient's prostate volume was estimated based on the drawn structure. The volume was 64.56 cc.  Given the pubic arch appearance and prostate volume, patient remains a good candidate to proceed with prostate seed implant. Today, he freely provided informed written consent to proceed.    PLAN: The patient will undergo prostate seed implant.   ________________________________  Sheral Apley. Tammi Klippel, M.D.

## 2016-12-15 NOTE — Progress Notes (Signed)
Spoke with Bradley Brooks to introduce myself as the  Prostate nurse navigator and my role. I was unable to meet him during his consult with Dr. Tammi Klippel. He is scheduled for CT simulation 12/17/16 at 11 am. He voiced understanding of this appointment. I look forward to seeing him 12/17/16.

## 2016-12-16 ENCOUNTER — Telehealth: Payer: Self-pay | Admitting: *Deleted

## 2016-12-16 NOTE — Telephone Encounter (Signed)
CALLED PATIENT TO REMIND OF PRE-SEED APPT. FOR 12-17-16, LVM FOR A RETURN CALL

## 2016-12-17 ENCOUNTER — Ambulatory Visit
Admission: RE | Admit: 2016-12-17 | Discharge: 2016-12-17 | Disposition: A | Discharge status: Home / Self Care | Payer: Medicare Other | Source: Ambulatory Visit | Attending: Radiation Oncology | Admitting: Radiation Oncology

## 2016-12-17 ENCOUNTER — Other Ambulatory Visit: Payer: Self-pay | Admitting: Urology

## 2016-12-17 ENCOUNTER — Ambulatory Visit (HOSPITAL_BASED_OUTPATIENT_CLINIC_OR_DEPARTMENT_OTHER)
Admission: RE | Admit: 2016-12-17 | Discharge: 2016-12-17 | Disposition: A | Discharge status: Home / Self Care | Payer: Medicare Other | Source: Ambulatory Visit | Attending: Urology | Admitting: Urology

## 2016-12-17 ENCOUNTER — Encounter (HOSPITAL_BASED_OUTPATIENT_CLINIC_OR_DEPARTMENT_OTHER)
Admission: RE | Admit: 2016-12-17 | Discharge: 2016-12-17 | Disposition: A | Discharge status: Home / Self Care | Payer: Medicare Other | Source: Ambulatory Visit | Attending: Urology | Admitting: Urology

## 2016-12-17 ENCOUNTER — Encounter: Payer: Self-pay | Admitting: Medical Oncology

## 2016-12-17 DIAGNOSIS — C61 Malignant neoplasm of prostate: Secondary | ICD-10-CM | POA: Diagnosis present

## 2016-12-17 NOTE — Progress Notes (Signed)
Mr. Molstad her for pre-seed implant CT. He states he is doing well without complaints. He is scheduled for implant 02/18/17. He has my business card and I asked him to call me with questions and concerns.He voiced understanding.

## 2016-12-29 ENCOUNTER — Other Ambulatory Visit: Payer: Self-pay | Admitting: Urology

## 2017-02-09 ENCOUNTER — Encounter (HOSPITAL_BASED_OUTPATIENT_CLINIC_OR_DEPARTMENT_OTHER): Payer: Self-pay | Admitting: *Deleted

## 2017-02-09 NOTE — Progress Notes (Signed)
NPO AFTER MN.   ARRIVE AT 0800.  GETTING LAB WORK DONE Thursday 02-11-2017 (CBC, CMET, PT/INR, PTT).  CURRENT EKG AND CXR IN CHART AND EPIC.  WILL DO FLEET ENEMA AM DOS.

## 2017-02-10 ENCOUNTER — Telehealth: Payer: Self-pay | Admitting: *Deleted

## 2017-02-10 NOTE — Telephone Encounter (Signed)
CALLED PATIENT TO REMIND OF LABS FOR 02-11-17 FOR PROCEDURE ON 02-28-17, SPOKE WITH PATIENT AND HE IS AWARE OF THIS APPT.

## 2017-02-11 DIAGNOSIS — Z9889 Other specified postprocedural states: Secondary | ICD-10-CM | POA: Diagnosis not present

## 2017-02-11 DIAGNOSIS — E7800 Pure hypercholesterolemia, unspecified: Secondary | ICD-10-CM | POA: Diagnosis not present

## 2017-02-11 DIAGNOSIS — C61 Malignant neoplasm of prostate: Secondary | ICD-10-CM | POA: Diagnosis present

## 2017-02-11 DIAGNOSIS — Z8042 Family history of malignant neoplasm of prostate: Secondary | ICD-10-CM | POA: Diagnosis not present

## 2017-02-11 DIAGNOSIS — Z79899 Other long term (current) drug therapy: Secondary | ICD-10-CM | POA: Diagnosis not present

## 2017-02-11 LAB — CBC
HEMATOCRIT: 43.4 % (ref 39.0–52.0)
HEMOGLOBIN: 15 g/dL (ref 13.0–17.0)
MCH: 28.5 pg (ref 26.0–34.0)
MCHC: 34.6 g/dL (ref 30.0–36.0)
MCV: 82.5 fL (ref 78.0–100.0)
Platelets: 129 10*3/uL — ABNORMAL LOW (ref 150–400)
RBC: 5.26 MIL/uL (ref 4.22–5.81)
RDW: 12.7 % (ref 11.5–15.5)
WBC: 5 10*3/uL (ref 4.0–10.5)

## 2017-02-11 LAB — COMPREHENSIVE METABOLIC PANEL
ALK PHOS: 64 U/L (ref 38–126)
ALT: 23 U/L (ref 17–63)
ANION GAP: 10 (ref 5–15)
AST: 21 U/L (ref 15–41)
Albumin: 4.3 g/dL (ref 3.5–5.0)
BILIRUBIN TOTAL: 0.6 mg/dL (ref 0.3–1.2)
BUN: 14 mg/dL (ref 6–20)
CALCIUM: 9.4 mg/dL (ref 8.9–10.3)
CO2: 25 mmol/L (ref 22–32)
CREATININE: 1.01 mg/dL (ref 0.61–1.24)
Chloride: 104 mmol/L (ref 101–111)
Glucose, Bld: 127 mg/dL — ABNORMAL HIGH (ref 65–99)
Potassium: 4 mmol/L (ref 3.5–5.1)
Sodium: 139 mmol/L (ref 135–145)
TOTAL PROTEIN: 7 g/dL (ref 6.5–8.1)

## 2017-02-11 LAB — PROTIME-INR
INR: 1.02
Prothrombin Time: 13.4 seconds (ref 11.4–15.2)

## 2017-02-11 LAB — APTT: aPTT: 32 seconds (ref 24–36)

## 2017-02-16 ENCOUNTER — Telehealth: Payer: Self-pay | Admitting: *Deleted

## 2017-02-16 NOTE — Telephone Encounter (Signed)
CALLED PATIENT TO REMIND OF PROCEDURE FOR 02-18-17, LVM FOR A RETURN CALL

## 2017-02-18 ENCOUNTER — Ambulatory Visit (HOSPITAL_BASED_OUTPATIENT_CLINIC_OR_DEPARTMENT_OTHER): Payer: Medicare Other | Admitting: Anesthesiology

## 2017-02-18 ENCOUNTER — Ambulatory Visit (HOSPITAL_BASED_OUTPATIENT_CLINIC_OR_DEPARTMENT_OTHER)
Admission: RE | Admit: 2017-02-18 | Discharge: 2017-02-18 | Disposition: A | Discharge status: Home / Self Care | Payer: Medicare Other | Source: Ambulatory Visit | Attending: Urology | Admitting: Urology

## 2017-02-18 ENCOUNTER — Ambulatory Visit (HOSPITAL_COMMUNITY): Payer: Medicare Other

## 2017-02-18 ENCOUNTER — Encounter (HOSPITAL_BASED_OUTPATIENT_CLINIC_OR_DEPARTMENT_OTHER)
Admission: RE | Disposition: A | Discharge status: Home / Self Care | Payer: Self-pay | Source: Ambulatory Visit | Attending: Urology

## 2017-02-18 ENCOUNTER — Encounter (HOSPITAL_BASED_OUTPATIENT_CLINIC_OR_DEPARTMENT_OTHER): Payer: Self-pay | Admitting: *Deleted

## 2017-02-18 DIAGNOSIS — E7800 Pure hypercholesterolemia, unspecified: Secondary | ICD-10-CM | POA: Insufficient documentation

## 2017-02-18 DIAGNOSIS — C61 Malignant neoplasm of prostate: Secondary | ICD-10-CM | POA: Diagnosis not present

## 2017-02-18 DIAGNOSIS — Z79899 Other long term (current) drug therapy: Secondary | ICD-10-CM | POA: Insufficient documentation

## 2017-02-18 DIAGNOSIS — Z8042 Family history of malignant neoplasm of prostate: Secondary | ICD-10-CM | POA: Insufficient documentation

## 2017-02-18 DIAGNOSIS — Z9889 Other specified postprocedural states: Secondary | ICD-10-CM | POA: Diagnosis not present

## 2017-02-18 HISTORY — DX: Polyneuropathy, unspecified: G62.9

## 2017-02-18 HISTORY — DX: Allergic rhinitis due to pollen: J30.1

## 2017-02-18 HISTORY — DX: Other intervertebral disc degeneration, lumbar region: M51.36

## 2017-02-18 HISTORY — DX: Hyperlipidemia, unspecified: E78.5

## 2017-02-18 HISTORY — PX: CYSTOSCOPY: SHX5120

## 2017-02-18 HISTORY — DX: Urge incontinence: N39.41

## 2017-02-18 HISTORY — PX: RADIOACTIVE SEED IMPLANT: SHX5150

## 2017-02-18 HISTORY — DX: Other intervertebral disc degeneration, lumbar region without mention of lumbar back pain or lower extremity pain: M51.369

## 2017-02-18 HISTORY — DX: Benign prostatic hyperplasia with lower urinary tract symptoms: N40.1

## 2017-02-18 SURGERY — INSERTION, RADIATION SOURCE, PROSTATE
Anesthesia: General | Site: Rectum

## 2017-02-18 MED ORDER — SODIUM CHLORIDE 0.9 % IV SOLN
250.0000 mL | INTRAVENOUS | Status: DC | PRN
  Filled 2017-02-18: qty 250

## 2017-02-18 MED ORDER — ONDANSETRON HCL 4 MG/2ML IJ SOLN
INTRAMUSCULAR | Status: AC
  Filled 2017-02-18: qty 4

## 2017-02-18 MED ORDER — TRAMADOL HCL 50 MG PO TABS: 50.0000 mg | ORAL_TABLET | Freq: Four times a day (QID) | ORAL | 0 refills | Status: AC | PRN

## 2017-02-18 MED ORDER — MEPERIDINE HCL 25 MG/ML IJ SOLN
6.2500 mg | INTRAMUSCULAR | Status: DC | PRN
  Filled 2017-02-18: qty 1

## 2017-02-18 MED ORDER — SODIUM CHLORIDE 0.9 % IR SOLN
Status: DC | PRN
  Administered 2017-02-18: 3000 mL via INTRAVESICAL

## 2017-02-18 MED ORDER — FENTANYL CITRATE (PF) 100 MCG/2ML IJ SOLN
INTRAMUSCULAR | Status: DC | PRN
  Administered 2017-02-18: 25 ug via INTRAVENOUS
  Administered 2017-02-18: 50 ug via INTRAVENOUS
  Administered 2017-02-18: 25 ug via INTRAVENOUS

## 2017-02-18 MED ORDER — LIDOCAINE HCL 2 % EX GEL
CUTANEOUS | Status: DC | PRN
  Administered 2017-02-18: 1 via URETHRAL

## 2017-02-18 MED ORDER — LACTATED RINGERS IV SOLN
INTRAVENOUS | Status: DC
  Administered 2017-02-18: 08:00:00 via INTRAVENOUS
  Filled 2017-02-18: qty 1000

## 2017-02-18 MED ORDER — FLEET ENEMA 7-19 GM/118ML RE ENEM
1.0000 | ENEMA | Freq: Once | RECTAL | Status: DC
  Filled 2017-02-18: qty 1

## 2017-02-18 MED ORDER — KETOROLAC TROMETHAMINE 30 MG/ML IJ SOLN
INTRAMUSCULAR | Status: DC | PRN
  Administered 2017-02-18: 15 mg via INTRAVENOUS

## 2017-02-18 MED ORDER — PROPOFOL 10 MG/ML IV BOLUS
INTRAVENOUS | Status: AC
  Filled 2017-02-18: qty 20

## 2017-02-18 MED ORDER — SODIUM CHLORIDE 0.9% FLUSH
3.0000 mL | Freq: Two times a day (BID) | INTRAVENOUS | Status: DC
  Filled 2017-02-18: qty 3

## 2017-02-18 MED ORDER — FENTANYL CITRATE (PF) 100 MCG/2ML IJ SOLN
INTRAMUSCULAR | Status: AC
  Filled 2017-02-18: qty 2

## 2017-02-18 MED ORDER — SODIUM CHLORIDE 0.9% FLUSH
3.0000 mL | INTRAVENOUS | Status: DC | PRN
  Filled 2017-02-18: qty 3

## 2017-02-18 MED ORDER — MORPHINE SULFATE (PF) 2 MG/ML IV SOLN
2.0000 mg | INTRAVENOUS | Status: DC | PRN
  Filled 2017-02-18: qty 1

## 2017-02-18 MED ORDER — FENTANYL CITRATE (PF) 100 MCG/2ML IJ SOLN
25.0000 ug | INTRAMUSCULAR | Status: DC | PRN
  Administered 2017-02-18: 25 ug via INTRAVENOUS
  Filled 2017-02-18: qty 1

## 2017-02-18 MED ORDER — SODIUM CHLORIDE 0.9 % IJ SOLN
INTRAMUSCULAR | Status: DC | PRN
  Administered 2017-02-18: 10 mL

## 2017-02-18 MED ORDER — DEXAMETHASONE SODIUM PHOSPHATE 10 MG/ML IJ SOLN
INTRAMUSCULAR | Status: AC
  Filled 2017-02-18: qty 1

## 2017-02-18 MED ORDER — KETOROLAC TROMETHAMINE 30 MG/ML IJ SOLN
INTRAMUSCULAR | Status: AC
  Filled 2017-02-18: qty 1

## 2017-02-18 MED ORDER — STERILE WATER FOR IRRIGATION IR SOLN
Status: DC | PRN
  Administered 2017-02-18: 500 mL

## 2017-02-18 MED ORDER — LIDOCAINE 2% (20 MG/ML) 5 ML SYRINGE
INTRAMUSCULAR | Status: DC | PRN
  Administered 2017-02-18: 75 mg via INTRAVENOUS

## 2017-02-18 MED ORDER — IOHEXOL 300 MG/ML  SOLN
INTRAMUSCULAR | Status: DC | PRN
  Administered 2017-02-18: 7 mL

## 2017-02-18 MED ORDER — CIPROFLOXACIN IN D5W 400 MG/200ML IV SOLN
INTRAVENOUS | Status: AC
  Filled 2017-02-18: qty 200

## 2017-02-18 MED ORDER — PROPOFOL 10 MG/ML IV BOLUS
INTRAVENOUS | Status: DC | PRN
  Administered 2017-02-18: 200 mg via INTRAVENOUS
  Administered 2017-02-18: 50 mg via INTRAVENOUS

## 2017-02-18 MED ORDER — DEXAMETHASONE SODIUM PHOSPHATE 10 MG/ML IJ SOLN
INTRAMUSCULAR | Status: DC | PRN
  Administered 2017-02-18: 10 mg via INTRAVENOUS

## 2017-02-18 MED ORDER — ACETAMINOPHEN 325 MG PO TABS
650.0000 mg | ORAL_TABLET | ORAL | Status: DC | PRN
  Filled 2017-02-18: qty 2

## 2017-02-18 MED ORDER — TAMSULOSIN HCL 0.4 MG PO CAPS: 0.4000 mg | ORAL_CAPSULE | Freq: Every day | ORAL | 1 refills | Status: DC

## 2017-02-18 MED ORDER — FENTANYL CITRATE (PF) 100 MCG/2ML IJ SOLN
INTRAMUSCULAR | Status: AC
Start: 2017-02-18 — End: 2017-02-18
  Filled 2017-02-18: qty 2

## 2017-02-18 MED ORDER — ONDANSETRON HCL 4 MG/2ML IJ SOLN
INTRAMUSCULAR | Status: DC | PRN
  Administered 2017-02-18: 4 mg via INTRAVENOUS

## 2017-02-18 MED ORDER — ONDANSETRON HCL 4 MG/2ML IJ SOLN
4.0000 mg | Freq: Once | INTRAMUSCULAR | Status: DC | PRN
  Filled 2017-02-18: qty 2

## 2017-02-18 MED ORDER — CIPROFLOXACIN HCL 500 MG PO TABS: 500.0000 mg | ORAL_TABLET | Freq: Two times a day (BID) | ORAL | 0 refills | Status: DC

## 2017-02-18 MED ORDER — CIPROFLOXACIN IN D5W 400 MG/200ML IV SOLN
400.0000 mg | INTRAVENOUS | Status: AC
  Administered 2017-02-18: 400 mg via INTRAVENOUS
  Filled 2017-02-18: qty 200

## 2017-02-18 MED ORDER — ACETAMINOPHEN 650 MG RE SUPP
650.0000 mg | RECTAL | Status: DC | PRN
  Filled 2017-02-18: qty 1

## 2017-02-18 MED ORDER — LIDOCAINE 2% (20 MG/ML) 5 ML SYRINGE
INTRAMUSCULAR | Status: AC
  Filled 2017-02-18: qty 5

## 2017-02-18 SURGICAL SUPPLY — 43 items
BAG DRAIN URO-CYSTO SKYTR STRL (DRAIN) ×3 IMPLANT
BAG URINE DRAINAGE (UROLOGICAL SUPPLIES) ×3 IMPLANT
BLADE CLIPPER SURG (BLADE) ×3 IMPLANT
CATH COUDE FOLEY 2W 5CC 20FR (CATHETERS) ×3 IMPLANT
CATH FOLEY 2WAY SLVR  5CC 16FR (CATHETERS) ×1
CATH FOLEY 2WAY SLVR 5CC 16FR (CATHETERS) ×2 IMPLANT
CATH ROBINSON RED A/P 20FR (CATHETERS) ×3 IMPLANT
CATH URET 5FR 28IN OPEN ENDED (CATHETERS) IMPLANT
CLOTH BEACON ORANGE TIMEOUT ST (SAFETY) ×3 IMPLANT
COVER BACK TABLE 60X90IN (DRAPES) ×3 IMPLANT
COVER MAYO STAND STRL (DRAPES) ×3 IMPLANT
DRSG TEGADERM 4X4.75 (GAUZE/BANDAGES/DRESSINGS) ×6 IMPLANT
DRSG TEGADERM 8X12 (GAUZE/BANDAGES/DRESSINGS) ×3 IMPLANT
ELECT REM PT RETURN 9FT ADLT (ELECTROSURGICAL)
ELECTRODE REM PT RTRN 9FT ADLT (ELECTROSURGICAL) IMPLANT
GAUZE SPONGE 4X4 12PLY STRL LF (GAUZE/BANDAGES/DRESSINGS) ×3 IMPLANT
GLOVE ECLIPSE 8.0 STRL XLNG CF (GLOVE) IMPLANT
GLOVE SURG SS PI 8.0 STRL IVOR (GLOVE) ×6 IMPLANT
GOWN STRL REUS W/ TWL LRG LVL3 (GOWN DISPOSABLE) ×2 IMPLANT
GOWN STRL REUS W/ TWL XL LVL3 (GOWN DISPOSABLE) ×2 IMPLANT
GOWN STRL REUS W/TWL LRG LVL3 (GOWN DISPOSABLE) ×1
GOWN STRL REUS W/TWL XL LVL3 (GOWN DISPOSABLE) ×1
GUIDEWIRE STR DUAL SENSOR (WIRE) IMPLANT
HOLDER FOLEY CATH W/STRAP (MISCELLANEOUS) ×3 IMPLANT
IMPL SPACEOAR SYSTEM 10ML (MISCELLANEOUS) ×2 IMPLANT
IMPLANT SPACEOAR SYSTEM 10ML (MISCELLANEOUS) ×3
IV NS 1000ML (IV SOLUTION)
IV NS 1000ML BAXH (IV SOLUTION) IMPLANT
KIT RM TURNOVER CYSTO AR (KITS) ×3 IMPLANT
MANIFOLD NEPTUNE II (INSTRUMENTS) IMPLANT
NDL SAFETY ECLIPSE 18X1.5 (NEEDLE) IMPLANT
NEEDLE HYPO 18GX1.5 SHARP (NEEDLE)
NEEDLE HYPO 22GX1.5 SAFETY (NEEDLE) IMPLANT
NS IRRIG 500ML POUR BTL (IV SOLUTION) IMPLANT
PACK CYSTO (CUSTOM PROCEDURE TRAY) ×3 IMPLANT
SYR 20CC LL (SYRINGE) IMPLANT
SYR BULB IRRIGATION 50ML (SYRINGE) ×3 IMPLANT
SYRINGE 10CC LL (SYRINGE) ×6 IMPLANT
TUBE CONNECTING 12X1/4 (SUCTIONS) IMPLANT
UNDERPAD 30X30 INCONTINENT (UNDERPADS AND DIAPERS) ×6 IMPLANT
WATER STERILE IRR 3000ML UROMA (IV SOLUTION) ×3 IMPLANT
WATER STERILE IRR 500ML POUR (IV SOLUTION) ×3 IMPLANT
selectSeed 1-125 ×3 IMPLANT

## 2017-02-18 NOTE — Anesthesia Postprocedure Evaluation (Signed)
Anesthesia Post Note  Patient: QUENTON RECENDEZ  Procedure(s) Performed: Procedure(s) (LRB): RADIOACTIVE SEED IMPLANT/BRACHYTHERAPY IMPLANT, CYSTOSCOPY (N/A) SPACE OAR (N/A)     Patient location during evaluation: PACU Anesthesia Type: General Level of consciousness: awake and alert Pain management: pain level controlled Vital Signs Assessment: post-procedure vital signs reviewed and stable Respiratory status: spontaneous breathing, nonlabored ventilation, respiratory function stable and patient connected to nasal cannula oxygen Cardiovascular status: blood pressure returned to baseline and stable Postop Assessment: no signs of nausea or vomiting Anesthetic complications: no    Last Vitals:  Vitals:   02/18/17 1115 02/18/17 1130  BP: (!) 155/81   Pulse: (!) 51 (!) 58  Resp: 12 18  Temp: (!) 36.4 C     Last Pain:  Vitals:   02/18/17 1115  TempSrc:   PainSc: 0-No pain                 Kysean Sweet

## 2017-02-18 NOTE — Discharge Instructions (Addendum)
°Post Anesthesia Home Care Instructions ° °Activity: °Get plenty of rest for the remainder of the day. A responsible individual must stay with you for 24 hours following the procedure.  °For the next 24 hours, DO NOT: °-Drive a car °-Operate machinery °-Drink alcoholic beverages °-Take any medication unless instructed by your physician °-Make any legal decisions or sign important papers. ° °Meals: °Start with liquid foods such as gelatin or soup. Progress to regular foods as tolerated. Avoid greasy, spicy, heavy foods. If nausea and/or vomiting occur, drink only clear liquids until the nausea and/or vomiting subsides. Call your physician if vomiting continues. ° °Special Instructions/Symptoms: °Your throat may feel dry or sore from the anesthesia or the breathing tube placed in your throat during surgery. If this causes discomfort, gargle with warm salt water. The discomfort should disappear within 24 hours. ° °If you had a scopolamine patch placed behind your ear for the management of post- operative nausea and/or vomiting: ° °1. The medication in the patch is effective for 72 hours, after which it should be removed.  Wrap patch in a tissue and discard in the trash. Wash hands thoroughly with soap and water. °2. You may remove the patch earlier than 72 hours if you experience unpleasant side effects which may include dry mouth, dizziness or visual disturbances. °3. Avoid touching the patch. Wash your hands with soap and water after contact with the patch. °  ° ° °Brachytherapy for Prostate Cancer, Care After °Refer to this sheet in the next few weeks. These instructions provide you with information on caring for yourself after your procedure. Your health care provider may also give you more specific instructions. Your treatment has been planned according to current medical practices, but problems sometimes occur. Call your health care provider if you have any problems or questions after your procedure. °What can I  expect after the procedure? °The area behind the scrotum will probably be tender and bruised. For a short period of time you may have: °· Difficulty passing urine. You may need a catheter for a few days to a month. °· Blood in the urine or semen. °· A feeling of constipation because of prostate swelling. °· Frequent feeling of an urgent need to urinate. ° °For a long period of time you may have: °· Inflammation of the rectum. This happens in about 2% of people who have the procedure. °· Erection problems. These vary with age and occur in about 15-40% of men. °· Difficulty urinating. This is caused by scarring in the urethra. °· Diarrhea. ° °Follow these instructions at home: °· Take medicines only as directed by your health care provider. °· You will probably have a catheter in your bladder for several days. You will have blood in the urine bag and should drink a lot of fluids to keep it a light red color. °· Keep all follow-up visits as directed by your health care provider. If you have a catheter, it will be removed during one of these visits. °· Try not to sit directly on the area behind the scrotum. A soft cushion can decrease the discomfort. Ice packs may also be helpful for the discomfort. Do not put ice directly on the skin. °· Shower and wash the area behind the scrotum gently. Do not sit in a tub. °· If you have had the brachytherapy that uses the seeds, limit your close contact with children and pregnant women for 2 months because of the radiation still in the prostate. After that period   the levels drop off quickly. Get help right away if:  You have a fever.  You have chills.  You have shortness of breath.  You have chest pain.  You have thick blood, like tomato juice, in the urine bag.  Your catheter is blocked so urine cannot get into the bag. Your bladder area or lower abdomen may be swollen.  There is excessive bleeding from your rectum. It is normal to have a little blood mixed  with your stool.  There is severe discomfort in the treated area that does not go away with pain medicine.  You have abdominal discomfort.  You have severe nausea or vomiting.  You develop any new or unusual symptoms. This information is not intended to replace advice given to you by your health care provider. Make sure you discuss any questions you have with your health care provider.  You may remove your catheter in the morning by cutting the side arm off.  The catheter should slide out easily.   If you have trouble removing the catheter or don't feel comfortable doing it, you may come to the office in the morning to have it done.   Please call [phone] after 8am to let them know you are coming.  Document Released: 09/05/2010 Document Revised: 01/15/2016 Document Reviewed: 01/24/2013 Elsevier Interactive Patient Education  2017 [employer].

## 2017-02-18 NOTE — Op Note (Signed)
PATIENT:  Bradley Brooks  PRE-OPERATIVE DIAGNOSIS:  Adenocarcinoma of the prostate  POST-OPERATIVE DIAGNOSIS:  Same  PROCEDURE:  Procedure(s): 1. I-125 radioactive seed implantation 2. Cystoscopy  SURGEON:  Surgeon(s): Irine Seal MD  Radiation oncologist: Dr. Tyler Pita  ANESTHESIA:  General  EBL:  Minimal  DRAINS: 74 French Foley catheter  INDICATION: Bradley Brooks is a 71 y.o. with Stage T1c, Gleason 7(3+4) prostate cancer who has elected brachytherapy for treatment.  Description of procedure: After informed consent the patient was brought to the major OR, placed on the table and administered general anesthesia. He was then moved to the modified lithotomy position with his perineum perpendicular to the floor. His perineum and genitalia were then sterilely prepped. An official timeout was then performed. A 16 French Foley catheter was then placed in the bladder and filled with dilute contrast, a rectal tube was placed in the rectum and the transrectal ultrasound probe was placed in the rectum and affixed to the stand. He was then sterilely draped.  The sterile grid was installed.   Anchor needles were then placed.   Real time ultrasonography was used along with the seed planning software spot-pro version 3.1-00. This was used to develop the seed plan including the number of needles as well as number of seeds required for complete and adequate coverage. Real-time ultrasonography was then used along with the previously developed plan and the Nucletron device to implant a total of 70 seeds using 17 needles for a target dose of 145 Gy. This proceeded without difficulty or complication.  A Foley catheter was then removed as well as the transrectal ultrasound probe and rectal probe. Flexible cystoscopy was then performed using the 17 French flexible scope which revealed a normal urethra throughout its length down to the sphincter which appeared intact. The prostatic urethra was about 3cm with  trilobar hyperplasia.  His middle lobe was in the mid prostatic urethra and was obstructing. The bladder was then entered and fully and systematically inspected.  The ureteral orifices were noted to be of normal configuration and position. The mucosa revealed no evidence of tumors. There were also no stones identified within the bladder.   He had some bleeding with a small clot. No seeds or spacers were seen and/or removed from the bladder.  The cystoscope was then removed.  An a 61fr coude foley was placed after urethral lubrication with 10ML of 2% lidocaine jelly.   Attempts with a 102fr straight foley and the coude were unsuccessful prior to the lidocaine.  The catheter was irrigated with return of small clots and clearing of the urine and then was placed to a drainage bag.   The drapes were removed.  The perineum was cleaned and dressed.  He was taken out of the lithotomy position and was awakened and taken to recovery room in stable and satisfactory condition. He tolerated procedure well and there were no intraoperative complications.

## 2017-02-18 NOTE — Progress Notes (Signed)
  Radiation Oncology         (336) [phone] ________________________________  Name: SONG GARRIS MRN: [id number]  Date: 02/18/2017  DOB: [birth year]       Prostate Seed Implant  LY:YTKPT, Purcell Nails, MD  No ref. provider found  DIAGNOSIS: 71 year-old gentleman with Stage T1c adenocarcinoma of the prostate with Gleason Score of 3+4, and PSA of 5.4    ICD-10-CM   1. Prostate cancer ([place]) C61 DG Chest 2 View    DG Chest 2 View    PROCEDURE: Insertion of radioactive I-125 seeds into the prostate gland.  RADIATION DOSE: 145 Gy, definitive therapy.  TECHNIQUE: JACQUAN SAVAS was brought to the operating room with the urologist. He was placed in the dorsolithotomy position. He was catheterized and a rectal tube was inserted. The perineum was shaved, prepped and draped. The ultrasound probe was then introduced into the rectum to see the prostate gland.  TREATMENT DEVICE: A needle grid was attached to the ultrasound probe stand and anchor needles were placed.  3D PLANNING: The prostate was imaged in 3D using a sagittal sweep of the prostate probe. These images were transferred to the planning computer. There, the prostate, urethra and rectum were defined on each axial reconstructed image. Then, the software created an optimized 3D plan and a few seed positions were adjusted. The quality of the plan was reviewed using [facility] information for the target and the following two organs at risk:  Urethra and Rectum.  Then the accepted plan was uploaded to the seed Selectron afterloading unit.  PROSTATE VOLUME STUDY:  Using transrectal ultrasound the volume of the prostate was verified to be 73 cc.  SPECIAL TREATMENT PROCEDURE/SUPERVISION AND HANDLING: The Nucletron FIRST system was used to place the needles under sagittal guidance. A total of 17 needles were used to deposit 70 seeds in the prostate gland. The individual seed activity was 0.643 mCi.  SpaceOAR:  Placed Uneventfully  COMPLEX SIMULATION: At the end  of the procedure, an anterior radiograph of the pelvis was obtained to document seed positioning and count. Cystoscopy was performed to check the urethra and bladder.  MICRODOSIMETRY: At the end of the procedure, the patient was emitting 0.45 mR/hr at 1 meter. Accordingly, he was considered safe for hospital discharge.  PLAN: The patient will return to the radiation oncology clinic for post implant CT dosimetry in three weeks.   ________________________________  Sheral Apley Tammi Klippel, M.D.

## 2017-02-18 NOTE — Transfer of Care (Signed)
Immediate Anesthesia Transfer of Care Note  Patient: Bradley Brooks  Procedure(s) Performed: Procedure(s): RADIOACTIVE SEED IMPLANT/BRACHYTHERAPY IMPLANT (N/A) SPACE OAR (N/A)  Patient Location: PACU  Anesthesia Type:General  Level of Consciousness: sedated  Airway & Oxygen Therapy: Patient Spontanous Breathing and Patient connected to nasal cannula oxygen  Post-op Assessment: Report given to RN  Post vital signs: Reviewed and stable  Last Vitals: 155/81, 48, 12, 100%, 97.5 Vitals:   02/18/17 0746  BP: (!) 145/76  Pulse: (!) 51  Resp: 16  Temp: (!) 36.4 C    Last Pain:  Vitals:   02/18/17 0746  TempSrc: Oral      Patients Stated Pain Goal: 6 (28/76/81 1572)  Complications: No apparent anesthesia complications

## 2017-02-18 NOTE — Anesthesia Procedure Notes (Signed)
Procedure Name: LMA Insertion Date/Time: 02/18/2017 9:41 AM Performed by: Bethena Roys T Pre-anesthesia Checklist: Patient identified, Emergency Drugs available, Suction available and Patient being monitored Patient Re-evaluated:Patient Re-evaluated prior to inductionOxygen Delivery Method: Circle system utilized Preoxygenation: Pre-oxygenation with 100% oxygen Intubation Type: IV induction Ventilation: Mask ventilation without difficulty LMA: LMA inserted LMA Size: 5.0 Number of attempts: 1 Airway Equipment and Method: Bite block Placement Confirmation: positive ETCO2 Dental Injury: Teeth and Oropharynx as per pre-operative assessment

## 2017-02-18 NOTE — Anesthesia Preprocedure Evaluation (Addendum)
Anesthesia Evaluation  Patient identified by MRN, date of birth, ID band Patient awake    Reviewed: Allergy & Precautions, H&P , NPO status , Patient's Chart, lab work & pertinent test results  History of Anesthesia Complications (+) Emergence Delirium and history of anesthetic complications (combative in PACU)  Airway Mallampati: II  TM Distance: >3 FB Neck ROM: Full    Dental no notable dental hx. (+) Teeth Intact   Pulmonary neg pulmonary ROS,    Pulmonary exam normal breath sounds clear to auscultation       Cardiovascular negative cardio ROS Normal cardiovascular exam Rhythm:Regular Rate:Normal     Neuro/Psych negative neurological ROS  negative psych ROS   GI/Hepatic negative GI ROS, Neg liver ROS, neg GERD  ,  Endo/Other  negative endocrine ROS  Renal/GU negative Renal ROS  negative genitourinary   Musculoskeletal negative musculoskeletal ROS (+)   Abdominal   Peds negative pediatric ROS (+)  Hematology negative hematology ROS (+)   Anesthesia Other Findings per pt was very combative when coming out of anesthesia from hemorrhoid surgery 03/ 2005  Reproductive/Obstetrics negative OB ROS                             Anesthesia Physical  Anesthesia Plan  ASA: II  Anesthesia Plan: General   Post-op Pain Management:    Induction: Intravenous  PONV Risk Score and Plan: 2 and Ondansetron and Dexamethasone  Airway Management Planned: LMA  Additional Equipment:   Intra-op Plan:   Post-operative Plan:   Informed Consent: I have reviewed the patients History and Physical, chart, labs and discussed the procedure including the risks, benefits and alternatives for the proposed anesthesia with the patient or authorized representative who has indicated his/her understanding and acceptance.   Dental advisory given  Plan Discussed with: CRNA and Surgeon  Anesthesia Plan  Comments: ( )                                        Anesthesia Evaluation  Patient identified by MRN, date of birth, ID band Patient awake    Reviewed: Allergy & Precautions, H&P , NPO status , Patient's Chart, lab work & pertinent test results  History of Anesthesia Complications (+) Emergence Delirium  Airway Mallampati: I      Dental  (+) Teeth Intact   Pulmonary neg pulmonary ROS,  clear to auscultation        Cardiovascular neg cardio ROS Regular Normal    Neuro/Psych Negative Psych ROS   GI/Hepatic   Endo/Other    Renal/GU      Musculoskeletal   Abdominal   Peds  Hematology   Anesthesia Other Findings   Reproductive/Obstetrics                           Anesthesia Physical Anesthesia Plan  ASA: I  Anesthesia Plan: Spinal   Post-op Pain Management:    Induction:   Airway Management Planned: Nasal Cannula  Additional Equipment:   Intra-op Plan:   Post-operative Plan:   Informed Consent: I have reviewed the patients History and Physical, chart, labs and discussed the procedure including the risks, benefits and alternatives for the proposed anesthesia with the patient or authorized representative who has indicated his/her understanding and acceptance.     Plan Discussed with:  Anesthesia Plan Comments:         Anesthesia Quick Evaluation  Anesthesia Quick Evaluation

## 2017-02-18 NOTE — H&P (Signed)
CC: I have prostate cancer.  HPI: Bradley Brooks is a 71 year-old male established patient who is here evaluation for treatment of prostate cancer.  He was found to have 2 cores on the right with 5 and 10% Gleason 7(3+4). His prostate was 24ml. He has mild LUTS. He has T1c Nx Mx disease. The [facility] nomogram predicts and 55% chance of OCD, 45% ECE, 2% LNI and 2% SVI.   June 2018: Presents today for preoperative H and P prior to undergoing brachytherapy with spacer placement on July 5. Patient with no recent fevers or infections. No recent weight loss or unexplained fatigue. Denies any new bone pain. Lower urinary tract symptoms remain mild and stable. He presents today with his wife.   He does have the pathology report from his biopsy. His PSA at his time of diagnosis was 5.4.   He has not undergone surgery for treatment. He has not undergone External Beam Radiation Therapy for treatment. He has not undergone Hormonal Therapy for treatment.   He does not have urinary incontinence. He has not recently had unwanted weight loss. He is not having pain in new locations.     ALLERGIES: Anesthesia Hydrocodone    MEDICATIONS: Co Q10 100 mg capsule  Pravachol 20 mg tablet  Tizanidine Hcl 4 mg capsule     GU PSH: Prostate Needle Biopsy - 10/09/2016    NON-GU PSH: Back Surgery (Unspecified) Hemorrhoidectomy Hernia Repair Surgical Pathology, Gross And Microscopic Examination For Prostate Needle - 10/09/2016    GU PMH: Prostate Cancer, T1c Nx Mx Gleason 7(3+4) low volume disease. 55ml prostate with minimal LUTS. He is interested in a seed implant and I will set him up to see Dr. Tammi Klippel. - 10/30/2016 Elevated PSA (Stable) - 10/09/2016, - 09/04/2016, PSA elevation, - 2015    NON-GU PMH: Anxiety Aortic stenosis Arthritis Encounter for general adult medical examination without abnormal findings, Encounter for preventive health examination Hypercholesterolemia    FAMILY HISTORY: Prostate Cancer -  Brother   SOCIAL HISTORY: Marital Status: Married Current Smoking Status: Patient has never smoked.   Tobacco Use Assessment Completed: Used Tobacco in last 30 days? Has never drank.  Does not drink caffeine. 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    REVIEW OF SYSTEMS:    GU Review Male:   Patient reports get up at night to urinate. Patient denies frequent urination, hard to postpone urination, burning/ pain with urination, leakage of urine, stream starts and stops, trouble starting your stream, have to strain to urinate , erection problems, and penile pain.  Gastrointestinal (Upper):   Patient denies vomiting, nausea, and indigestion/ heartburn.  Gastrointestinal (Lower):   Patient denies diarrhea and constipation.  Constitutional:   Patient reports fatigue. Patient denies fever, night sweats, and weight loss.  Skin:   Patient denies skin rash/ lesion and itching.  Eyes:   Patient denies blurred vision and double vision.  Ears/ Nose/ Throat:   Patient reports sore throat and sinus problems.   Hematologic/Lymphatic:   Patient denies swollen glands and easy bruising.  Cardiovascular:   Patient denies leg swelling and chest pains.  Respiratory:   Patient reports cough and shortness of breath.   Endocrine:   Patient reports excessive thirst.   Musculoskeletal:   Patient reports back pain. Patient denies joint pain.  Neurological:   Patient denies headaches and dizziness.  Psychologic:   Patient denies depression and anxiety.   VITAL SIGNS:      02/04/2017 01:19 PM  Weight 210 lb / 95.25  kg  BP 132/79 mmHg  Pulse 53 /min  Temperature 96.7 F / 36 C   MULTI-SYSTEM PHYSICAL EXAMINATION:    Constitutional: Well-nourished. No physical deformities. Normally developed. Good grooming.  Neck: Neck symmetrical, not swollen. Normal tracheal position.  Respiratory: No labored breathing, no use of accessory muscles. CTA  Cardiovascular: Normal temperature, RRR without murmur.   Lymphatic: No  enlargement of neck, axillae, groin.  Skin: No paleness, no jaundice, no cyanosis. No lesion, no ulcer, no rash.  Neurologic / Psychiatric: Oriented to time, oriented to place, oriented to person. No depression, no anxiety, no agitation.  Gastrointestinal: No hernia. No mass, no tenderness, no rigidity, non obese abdomen. No flank or suprapubic tenderness.  Musculoskeletal: Spine, ribs, pelvis no bilateral tenderness. Normal gait and station of head and neck.     PAST DATA REVIEWED:  Source Of History:  Patient, Family/Caregiver  Lab Test Review:   PSA  Records Review:   Pathology Reports, Previous Patient Records  Urine Test Review:   Urinalysis   09/07/16  PSA  Total PSA 5.2 ng/dl  Free PSA 1.8 ng/dl  % Free PSA 35 %    02/04/17  Urinalysis  Urine Appearance Clear   Urine Color Yellow   Urine Glucose Neg   Urine Bilirubin Neg   Urine Ketones Neg   Urine Specific Gravity 1.025   Urine Blood Neg   Urine pH 6.5   Urine Protein Trace   Urine Urobilinogen 0.2   Urine Nitrites Neg   Urine Leukocyte Esterase Neg    PROCEDURES:          Urinalysis Dipstick Dipstick Cont'd  Color: Yellow Bilirubin: Neg  Appearance: Clear Ketones: Neg  Specific Gravity: 1.025 Blood: Neg  pH: 6.5 Protein: Trace  Glucose: Neg Urobilinogen: 0.2    Nitrites: Neg    Leukocyte Esterase: Neg    ASSESSMENT:      ICD-10 Details  1 GU:   Prostate Cancer - C61    PLAN:           Orders Labs Urine Culture          Schedule Return Visit/Planned Activity: Keep Scheduled Appointment - Schedule Surgery          Document Letter(s):  Created for Patient: Clinical Summary         Notes:   PE wnl. All questions answered to best of my ability. Urine sample will be sent for culture today as part of preoperative examination. I plan on seeing the patient a few weeks after his procedure. Instructed to phone the office if patient and his wife have any additional questions regarding brachytherapy and  spacer placement on July 5.

## 2017-02-19 ENCOUNTER — Encounter (HOSPITAL_BASED_OUTPATIENT_CLINIC_OR_DEPARTMENT_OTHER): Payer: Self-pay | Admitting: Urology

## 2017-02-19 ENCOUNTER — Ambulatory Visit (INDEPENDENT_AMBULATORY_CARE_PROVIDER_SITE_OTHER): Payer: Medicare Other | Admitting: Urology

## 2017-02-19 DIAGNOSIS — C61 Malignant neoplasm of prostate: Secondary | ICD-10-CM

## 2017-03-07 NOTE — Progress Notes (Signed)
  Radiation Oncology         (336) [phone] ________________________________  Name: Bradley Brooks MRN: [id number]  Date: 03/12/2017  DOB: Nov 21, 1945  COMPLEX SIMULATION NOTE  NARRATIVE:  The patient was brought to the [facility] today following prostate seed implantation approximately one month ago.  Identity was confirmed.  All relevant records and images related to the planned course of therapy were reviewed.  Then, the patient was set-up supine.  CT images were obtained.  The CT images were loaded into the planning software.  Then the prostate and rectum were contoured.  Treatment planning then occurred.  The implanted iodine 125 seeds were identified by the physics staff for projection of radiation distribution  I have requested : 3D Simulation  I have requested a DVH of the following structures: Prostate and rectum.    ________________________________  Sheral Apley Tammi Klippel, M.D.

## 2017-03-07 NOTE — Progress Notes (Signed)
Radiation Oncology         (336) [phone] ________________________________  Name: Bradley Brooks MRN: [id number]  Date: 03/12/2017  DOB: 1945/09/22  Follow-Up Visit Note  CC: Redmond School, MD  Irine Seal, MD  Diagnosis:   71 year-old gentleman with Stage T1c adenocarcinoma of the prostate with Gleason Score of 3+4, and PSA of 5.4    ICD-10-CM   1. Prostate cancer ([place]) C61     Interval Since Last Radiation:  3 weeks 02/18/17:  Insertion of radioactive I-125 seeds into the prostate gland; 145 Gy, definitive therapy and SpaceOAR impalnt  Narrative:  The patient returns today for routine follow-up.  He is complaining of increased urinary frequency and urinary hesitation symptoms. He filled out a questionnaire regarding urinary function today providing and overall IPSS score of 14 characterizing his symptoms as moderate.  His pre-implant score was 10. He denies any bowel symptoms. He has mild residual dysuria but denies gross hematuria, fever or chills.  He is taking Flomax daily with significant improvement in his LUTS.  He had a follow up at [facility] on 03/11/17 and has a scheduled follow up with Dr. Jeffie Pollock on 06/23/17 for PSA.  ALLERGIES:  is allergic to hydrocodone.  Meds: Current Outpatient Prescriptions  Medication Sig Dispense Refill  . pravastatin (PRAVACHOL) 20 MG tablet Take 20 mg by mouth every evening.     . tamsulosin (FLOMAX) 0.4 MG CAPS capsule Take 1 capsule (0.4 mg total) by mouth daily. 30 capsule 1  . traMADol (ULTRAM) 50 MG tablet Take 1 tablet (50 mg total) by mouth every 6 (six) hours as needed for moderate pain. 8 tablet 0   No current facility-administered medications for this encounter.     Physical Findings:  weight is 210 lb (95.3 kg). His blood pressure is 121/79 and his pulse is 78. His respiration is 16 and oxygen saturation is 100%.   In general this is a well appearing african [place] male in no acute distress. He's alert and oriented x4 and  appropriate throughout the examination. Cardiopulmonary assessment is negative for acute distress and he exhibits normal effort.    Lab Findings: Lab Results  Component Value Date   WBC 5.0 02/11/2017   HGB 15.0 02/11/2017   HCT 43.4 02/11/2017   MCV 82.5 02/11/2017   PLT 129 (L) 02/11/2017    Radiographic Findings:  Patient underwent CT imaging in our clinic for post implant dosimetry. Dr. Alejandro Mulling reviewed the scan and the CT appears to demonstrate an adequate distribution of radioactive seeds throughout the prostate gland. There no seeds in her near the rectum. We would expect that the final radiation plan and dosimetry will show appropriate coverage of the prostate gland.   Impression: The patient is recovering from the effects of radiation. His urinary symptoms should gradually improve over the next 4-6 months. We talked about this today. He is encouraged by his improvement already and is otherwise pleased with his outcome.   Plan: Today, we spent time talking to the patient about his prostate seed implant and resolving urinary symptoms. We also talked about long-term follow-up for prostate cancer following seed implant. He understands that ongoing PSA determinations and digital rectal exams will help perform surveillance to rule out disease recurrence. He understands what to expect with his PSA measures. Patient was also educated today about some of the long-term effects from radiation including a small risk for rectal bleeding and possibly erectile dysfunction. We talked about some of the general management  approaches to these potential complications. However, I did encourage the patient to contact our office or return at any point if he has questions or concerns related to his previous radiation and prostate cancer.    Nicholos Johns, PA-C  _____________________________________  Sheral Apley Tammi Klippel, M.D.

## 2017-03-11 ENCOUNTER — Telehealth: Payer: Self-pay | Admitting: *Deleted

## 2017-03-11 NOTE — Telephone Encounter (Signed)
CALLED PATIENT TO INFORM OF POST SEED APPTS. FOR 03-12-17, SPOKE WITH PATIENT AND HE IS AWARE OF THESE APPTS.

## 2017-03-12 ENCOUNTER — Ambulatory Visit
Admission: RE | Admit: 2017-03-12 | Discharge: 2017-03-12 | Disposition: A | Discharge status: Home / Self Care | Payer: Medicare Other | Source: Ambulatory Visit | Attending: Radiation Oncology | Admitting: Radiation Oncology

## 2017-03-12 ENCOUNTER — Ambulatory Visit (HOSPITAL_COMMUNITY)
Admission: RE | Admit: 2017-03-12 | Discharge: 2017-03-12 | Disposition: A | Discharge status: Home / Self Care | Payer: Medicare Other | Source: Ambulatory Visit | Attending: Urology | Admitting: Urology

## 2017-03-12 ENCOUNTER — Ambulatory Visit (HOSPITAL_COMMUNITY): Admission: RE | Admit: 2017-03-12 | Payer: Medicare Other | Source: Ambulatory Visit

## 2017-03-12 VITALS — BP 121/79 | HR 78 | Resp 16 | Wt 210.0 lb

## 2017-03-12 DIAGNOSIS — C61 Malignant neoplasm of prostate: Secondary | ICD-10-CM | POA: Diagnosis not present

## 2017-03-12 DIAGNOSIS — I350 Nonrheumatic aortic (valve) stenosis: Secondary | ICD-10-CM | POA: Insufficient documentation

## 2017-03-12 DIAGNOSIS — F419 Anxiety disorder, unspecified: Secondary | ICD-10-CM | POA: Insufficient documentation

## 2017-03-12 DIAGNOSIS — N5089 Other specified disorders of the male genital organs: Secondary | ICD-10-CM | POA: Insufficient documentation

## 2017-03-12 DIAGNOSIS — N433 Hydrocele, unspecified: Secondary | ICD-10-CM | POA: Insufficient documentation

## 2017-03-12 DIAGNOSIS — N503 Cyst of epididymis: Secondary | ICD-10-CM | POA: Diagnosis not present

## 2017-03-12 DIAGNOSIS — E7800 Pure hypercholesterolemia, unspecified: Secondary | ICD-10-CM | POA: Insufficient documentation

## 2017-03-12 DIAGNOSIS — R300 Dysuria: Secondary | ICD-10-CM | POA: Diagnosis not present

## 2017-03-12 NOTE — Progress Notes (Signed)
Weight and vitals stable. Denies pain. Pre seed IPSS 10. Post seed IPSS 14. Reports mild dysuria. Denies hematuria. Reports less urgency and leakage today than since seeds were inserted. Seen by Dr. Ralene Muskrat assistant yesterday who refilled his flomax. Scheduled to follow up with Dr. Jeffie Pollock on 11/7 after labs on 10/31.   BP 121/79 (BP Location: Left Arm, Patient Position: Sitting, Cuff Size: Normal)   Pulse 78   Resp 16   Wt 210 lb (95.3 kg)   SpO2 100%   BMI 30.13 kg/m  Wt Readings from Last 3 Encounters:  03/12/17 210 lb (95.3 kg)  02/18/17 210 lb (95.3 kg)  11/25/16 215 lb 3.2 oz (97.6 kg)

## 2017-04-13 ENCOUNTER — Encounter: Payer: Self-pay | Admitting: Radiation Oncology

## 2017-04-13 DIAGNOSIS — C61 Malignant neoplasm of prostate: Secondary | ICD-10-CM | POA: Diagnosis not present

## 2017-04-16 NOTE — Progress Notes (Signed)
  Radiation Oncology         (336) [phone] ________________________________  Name: RYANN LEAVITT MRN: [id number]  Date: 04/13/2017  DOB: [birth year]  3D Planning Note   Prostate Brachytherapy Post-Implant Dosimetry  Diagnosis: 71 year-old gentleman with Stage T1c adenocarcinoma of the prostate with Gleason Score of 3+4, and PSA of 5.4  Narrative: On a previous date, JUVON TEATER returned following prostate seed implantation for post implant planning. He underwent CT scan complex simulation to delineate the three-dimensional structures of the pelvis and demonstrate the radiation distribution.  Since that time, the seed localization, and complex isodose planning with dose volume histograms have now been completed.  Results:   Prostate Coverage - The dose of radiation delivered to the 90% or more of the prostate gland (D90) was 89.6% of the prescription dose. This essentially meets our goal of 90% or higher. Rectal Sparing - The volume of rectal tissue receiving the prescription dose or higher was 0.0 cc. This falls under our thresholds tolerance of 1.0 cc.  Impression: The prostate seed implant appears to show adequate target coverage and appropriate rectal sparing.  Plan:  The patient will continue to follow with urology for ongoing PSA determinations. I would anticipate a high likelihood for local tumor control with minimal risk for rectal morbidity.  ________________________________  Sheral Apley Tammi Klippel, M.D.

## 2017-08-30 ENCOUNTER — Ambulatory Visit (HOSPITAL_COMMUNITY)
Admission: RE | Admit: 2017-08-30 | Discharge: 2017-08-30 | Disposition: A | Discharge status: Home / Self Care | Payer: Medicare Other | Source: Ambulatory Visit | Attending: Physician Assistant | Admitting: Physician Assistant

## 2017-08-30 ENCOUNTER — Other Ambulatory Visit (HOSPITAL_COMMUNITY): Payer: Self-pay | Admitting: Physician Assistant

## 2017-08-30 DIAGNOSIS — J42 Unspecified chronic bronchitis: Secondary | ICD-10-CM | POA: Diagnosis not present

## 2017-08-30 DIAGNOSIS — R0602 Shortness of breath: Secondary | ICD-10-CM | POA: Diagnosis present

## 2018-04-01 ENCOUNTER — Other Ambulatory Visit: Payer: Self-pay

## 2018-04-01 ENCOUNTER — Encounter (HOSPITAL_COMMUNITY): Payer: Self-pay | Admitting: *Deleted

## 2018-04-01 ENCOUNTER — Emergency Department (HOSPITAL_COMMUNITY)
Admission: EM | Admit: 2018-04-01 | Discharge: 2018-04-01 | Disposition: A | Discharge status: Home / Self Care | Payer: Medicare Other | Attending: Emergency Medicine | Admitting: Emergency Medicine

## 2018-04-01 DIAGNOSIS — Z8546 Personal history of malignant neoplasm of prostate: Secondary | ICD-10-CM | POA: Diagnosis not present

## 2018-04-01 DIAGNOSIS — R197 Diarrhea, unspecified: Secondary | ICD-10-CM | POA: Diagnosis not present

## 2018-04-01 DIAGNOSIS — R51 Headache: Secondary | ICD-10-CM | POA: Insufficient documentation

## 2018-04-01 DIAGNOSIS — Z79899 Other long term (current) drug therapy: Secondary | ICD-10-CM | POA: Insufficient documentation

## 2018-04-01 DIAGNOSIS — R519 Headache, unspecified: Secondary | ICD-10-CM

## 2018-04-01 LAB — CBC WITH DIFFERENTIAL/PLATELET
Basophils Absolute: 0 10*3/uL (ref 0.0–0.1)
Basophils Relative: 0 %
Eosinophils Absolute: 0.2 10*3/uL (ref 0.0–0.7)
Eosinophils Relative: 2 %
HEMATOCRIT: 42.3 % (ref 39.0–52.0)
HEMOGLOBIN: 14.2 g/dL (ref 13.0–17.0)
LYMPHS ABS: 2.1 10*3/uL (ref 0.7–4.0)
Lymphocytes Relative: 27 %
MCH: 29 pg (ref 26.0–34.0)
MCHC: 33.6 g/dL (ref 30.0–36.0)
MCV: 86.3 fL (ref 78.0–100.0)
MONOS PCT: 8 %
Monocytes Absolute: 0.7 10*3/uL (ref 0.1–1.0)
NEUTROS ABS: 4.9 10*3/uL (ref 1.7–7.7)
NEUTROS PCT: 63 %
Platelets: 138 10*3/uL — ABNORMAL LOW (ref 150–400)
RBC: 4.9 MIL/uL (ref 4.22–5.81)
RDW: 12.6 % (ref 11.5–15.5)
WBC: 7.8 10*3/uL (ref 4.0–10.5)

## 2018-04-01 LAB — COMPREHENSIVE METABOLIC PANEL
ALT: 21 U/L (ref 0–44)
ANION GAP: 6 (ref 5–15)
AST: 19 U/L (ref 15–41)
Albumin: 3.8 g/dL (ref 3.5–5.0)
Alkaline Phosphatase: 54 U/L (ref 38–126)
BUN: 17 mg/dL (ref 8–23)
CHLORIDE: 104 mmol/L (ref 98–111)
CO2: 25 mmol/L (ref 22–32)
Calcium: 9.1 mg/dL (ref 8.9–10.3)
Creatinine, Ser: 1.01 mg/dL (ref 0.61–1.24)
GFR calc Af Amer: >60 mL/min (ref >60)
GFR calc non Af Amer: >60 mL/min (ref >60)
Glucose, Bld: 186 mg/dL — ABNORMAL HIGH (ref 70–99)
POTASSIUM: 3.8 mmol/L (ref 3.5–5.1)
Sodium: 135 mmol/L (ref 135–145)
Total Bilirubin: 0.8 mg/dL (ref 0.3–1.2)
Total Protein: 6.5 g/dL (ref 6.5–8.1)

## 2018-04-01 LAB — URINALYSIS, ROUTINE W REFLEX MICROSCOPIC
BILIRUBIN URINE: NEGATIVE
Glucose, UA: NEGATIVE mg/dL
Hgb urine dipstick: NEGATIVE
Ketones, ur: NEGATIVE mg/dL
Leukocytes, UA: NEGATIVE
Nitrite: NEGATIVE
PH: 6 (ref 5.0–8.0)
Protein, ur: NEGATIVE mg/dL
SPECIFIC GRAVITY, URINE: 1.021 (ref 1.005–1.030)

## 2018-04-01 MED ORDER — SODIUM CHLORIDE 0.9 % IV BOLUS
500.0000 mL | Freq: Once | INTRAVENOUS | Status: AC
  Administered 2018-04-01: 500 mL via INTRAVENOUS

## 2018-04-01 MED ORDER — KETOROLAC TROMETHAMINE 30 MG/ML IJ SOLN
15.0000 mg | Freq: Once | INTRAMUSCULAR | Status: AC
  Administered 2018-04-01: 15 mg via INTRAVENOUS
  Filled 2018-04-01: qty 1

## 2018-04-01 NOTE — Discharge Instructions (Signed)
Try and keep yourself hydrated I think that will help with the headache.  Follow-up with Dr. Gerarda Fraction as needed.

## 2018-04-01 NOTE — ED Triage Notes (Signed)
Pt c/o headache and diarrhea, chills that started two days ago, bilateral leg pain that started today,

## 2018-04-02 NOTE — ED Provider Notes (Signed)
[facility] EMERGENCY DEPARTMENT Provider Note   CSN: [id number] Arrival date & time: 04/01/18  2041     History   Chief Complaint Chief Complaint  Patient presents with  . Headache    HPI Bradley Brooks is a 72 y.o. male.  HPI Patient presents with nausea vomiting diarrhea and headache.  Began around 2 days ago.  Has had chills.  Leg pain and myalgias got worse today.  No blood in the diarrhea.  No emesis.  Headache is dull and in the front of his head.  It is throbbing.Slight abdominal pain. Past Medical History:  Diagnosis Date  . Anxiety   . Arthritis    knees, fingers  . Complication of anesthesia    per pt was very combative when coming out of anesthesia from hemorrhoid surgery 03/ 2005  . DDD (degenerative disc disease), lumbar   . Hyperlipidemia   . Hyperplasia of prostate with lower urinary tract symptoms (LUTS)   . Prostate cancer [facility]) urologist-  dr wrenn/  oncologist-  dr Tammi Klippel   dx 10-09-2016-- Stage T1c,  Gleason 3+4,  PSA 5.4,  vol 58cc  . Seasonal allergic rhinitis due to pollen   . Small fiber neuropathy neurologist-  dr Krista Blue   bilateral feet-- heaviness , bottom of feet numbness/ tingling  . Urge urinary incontinence     Patient Active Problem List   Diagnosis Date Noted  . Prostate cancer ([place]) 11/25/2016  . Leg pain 10/16/2015  . Paresthesia 09/09/2015    Past Surgical History:  Procedure Laterality Date  . COLONOSCOPY N/A 01/24/2014   Procedure: COLONOSCOPY;  Surgeon: Rogene Houston, MD;  [facility];  Service: Endoscopy;  Laterality: N/A;  830  . CYSTOSCOPY N/A 02/18/2017   Procedure: SPACE OAR;  Surgeon: Irine Seal, MD;  Location: [facility];  Service: Urology;  Laterality: N/A;  . EXCISION SEBACEOUS CYST OF BACK  03/21/2010  . EXTENSIVE HEMORRHOIDECTOMY/  FISSURECTOMY  11/14/2003  . INGUINAL HERNIA REPAIR  08/31/2011   Procedure: HERNIA REPAIR INGUINAL ADULT;  Surgeon: Donato Heinz, MD;  Location: [facility];   Service: General;  Laterality: Left;  . PROSTATE BIOPSY  10-09-2016  dr Jeffie Pollock office  . RADIOACTIVE SEED IMPLANT N/A 02/18/2017   Procedure: RADIOACTIVE SEED IMPLANT/BRACHYTHERAPY IMPLANT, CYSTOSCOPY;  Surgeon: Irine Seal, MD;  Location: [facility];  Service: Urology;  Laterality: N/A;  no seeds found in bladder per Dr Roni Bread, 59 seeds implanted        Home Medications    Prior to Admission medications   Medication Sig Start Date End Date Taking? Authorizing Provider  pravastatin (PRAVACHOL) 20 MG tablet Take 20 mg by mouth every evening.     [provider]  tamsulosin (FLOMAX) 0.4 MG CAPS capsule Take 1 capsule (0.4 mg total) by mouth daily. 02/18/17   Irine Seal, MD    Family History Family History  Problem Relation Age of Onset  . Heart disease Mother   . Cancer Sister        breast  . Cancer Brother        prostate  . Cancer Brother   . Cancer Brother   . Cancer Brother   . Cancer Brother   . Cancer Brother   . Anesthesia problems Neg Hx   . Hypotension Neg Hx   . Malignant hyperthermia Neg Hx   . Pseudochol deficiency Neg Hx     Social History Social History   Tobacco Use  .  Smoking status: Never Smoker  . Smokeless tobacco: Never Used  Substance Use Topics  . Alcohol use: No  . Drug use: No     Allergies   Hydrocodone   Review of Systems Review of Systems  Constitutional: Positive for appetite change and chills. Negative for fever.  HENT: Negative for congestion.   Respiratory: Negative for shortness of breath.   Cardiovascular: Negative for chest pain.  Gastrointestinal: Positive for abdominal pain and diarrhea.  Genitourinary: Negative for flank pain.  Musculoskeletal: Positive for myalgias.  Neurological: Positive for headaches.  Hematological: Negative for adenopathy.  Psychiatric/Behavioral: Negative for confusion.     Physical Exam Updated Vital Signs BP 132/70   Pulse 71   Temp 98.1 F (36.7 C) (Oral)   Resp  14   Ht 5\' 11"  (1.803 m)   Wt 95.9 kg   SpO2 94%   BMI 29.48 kg/m    Physical Exam  Constitutional: He appears well-developed.  HENT:  Head: Atraumatic.  Eyes: EOM are normal.  Neck: Neck supple.  No neck rigidity.  Cardiovascular: Normal rate.  Pulmonary/Chest: Effort normal.  Abdominal: There is no tenderness.  Musculoskeletal: Normal range of motion.  Neurological: He is alert.  Skin: Skin is warm. Capillary refill takes less than 2 seconds.     ED Treatments / Results  Labs (all labs ordered are listed, but only abnormal results are displayed) Labs Reviewed  COMPREHENSIVE METABOLIC PANEL - Abnormal; Notable for the following components:      Result Value   Glucose, Bld 186 (*)    All other components within normal limits  CBC WITH DIFFERENTIAL/PLATELET - Abnormal; Notable for the following components:   Platelets 138 (*)    All other components within normal limits  URINALYSIS, ROUTINE W REFLEX MICROSCOPIC    EKG None  Radiology No results found.  Procedures Procedures (including critical care time)  Medications Ordered in ED Medications  sodium chloride 0.9 % bolus 500 mL (0 mLs Intravenous Stopped 04/01/18 2301)  ketorolac (TORADOL) 30 MG/ML injection 15 mg (15 mg Intravenous Given 04/01/18 2301)     Initial Impression / Assessment and Plan / ED Course  I have reviewed the triage vital signs and the nursing notes.  Pertinent labs & imaging results that were available during my care of the patient were reviewed by me and considered in my medical decision making (see chart for details).     Patient with diarrhea and headache.  Doubt meningitis.  Feels better after treatment.  Labs reassuring.  Will discharge home.  Final Clinical Impressions(s) / ED Diagnoses   Final diagnoses:  Acute nonintractable headache, unspecified headache type  Diarrhea, unspecified type    ED Discharge Orders    None      Davonna Belling, MD 04/02/18 [phone]

## 2019-01-02 IMAGING — US US ART/VEN ABD/PELV/SCROTUM DOPPLER LTD
1 series · 13 of 25 positions shown · non-contrast
Comparison: No recent prior.

CLINICAL DATA: Testicular pain.  Swelling.

EXAM:
SCROTAL ULTRASOUND
DOPPLER ULTRASOUND OF THE TESTICLES
TECHNIQUE: Complete ultrasound examination of the testicles, epididymis, and
other scrotal structures was performed. Color and spectral Doppler
ultrasound were also utilized to evaluate blood flow to the
testicles.

[Series 1: us art/ven abd/pelv/scrotum doppler ltd · 0.08mm/px · 13 of 64 slices shown]
[im 1/64]
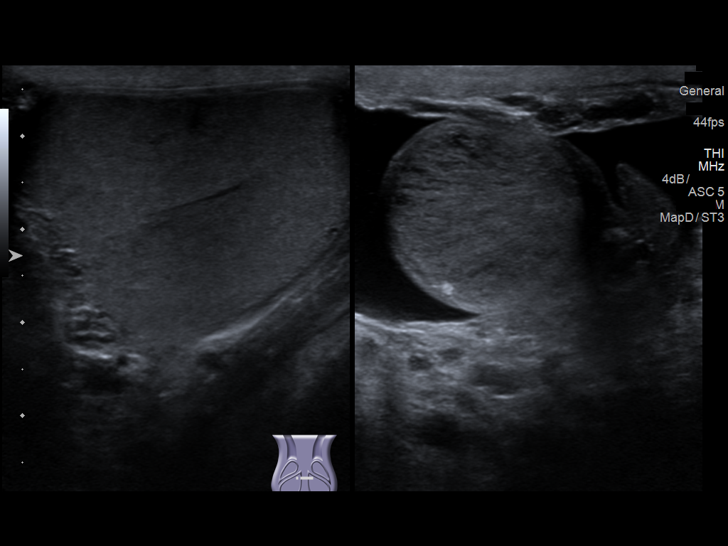
[im 6/64]
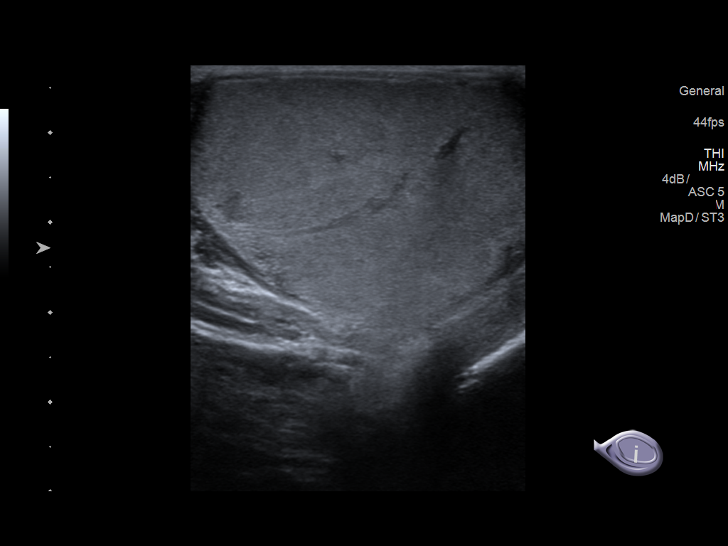
[im 11/64]
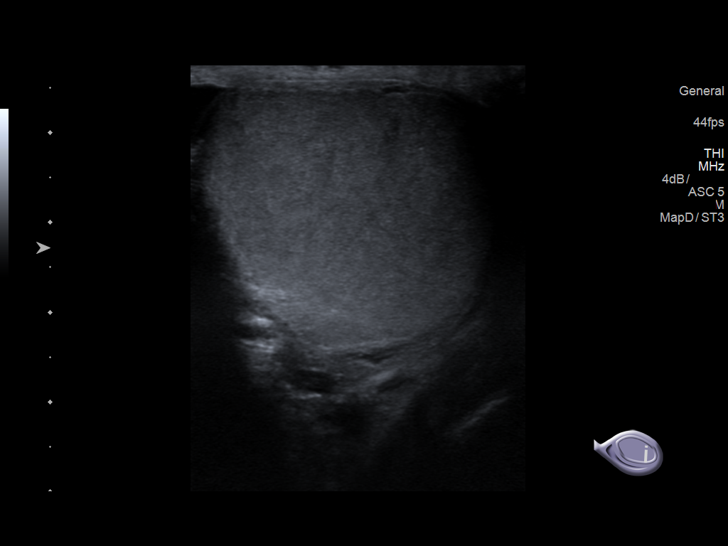
[im 16/64]
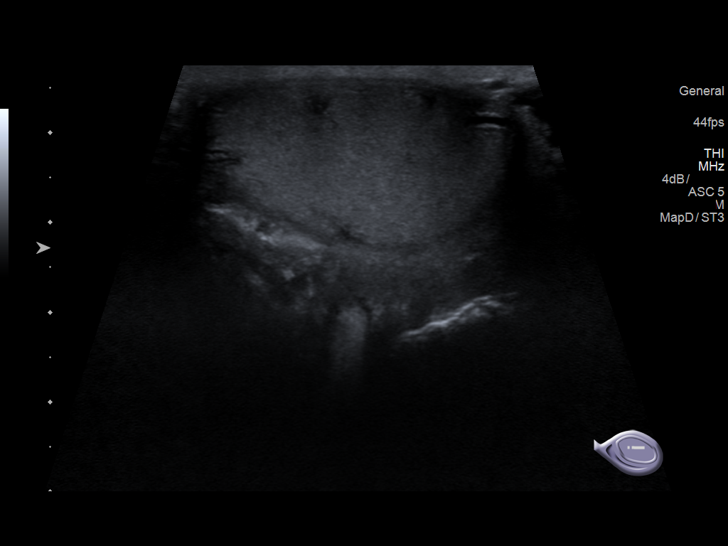
[im 22/64]
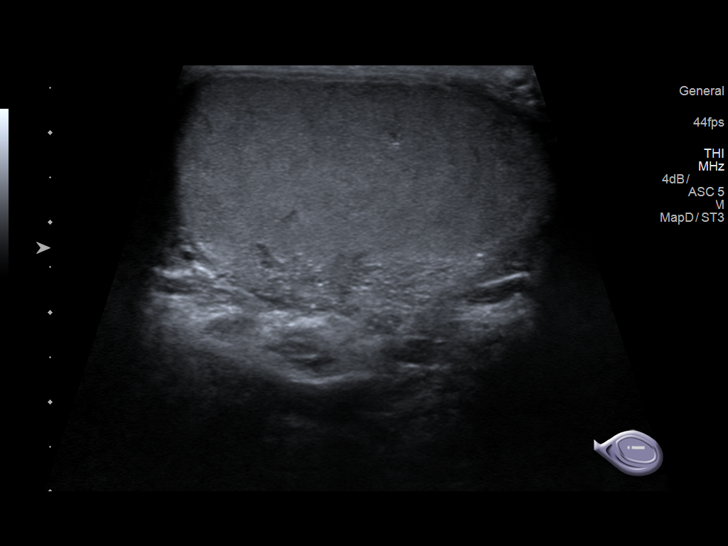
[im 27/64]
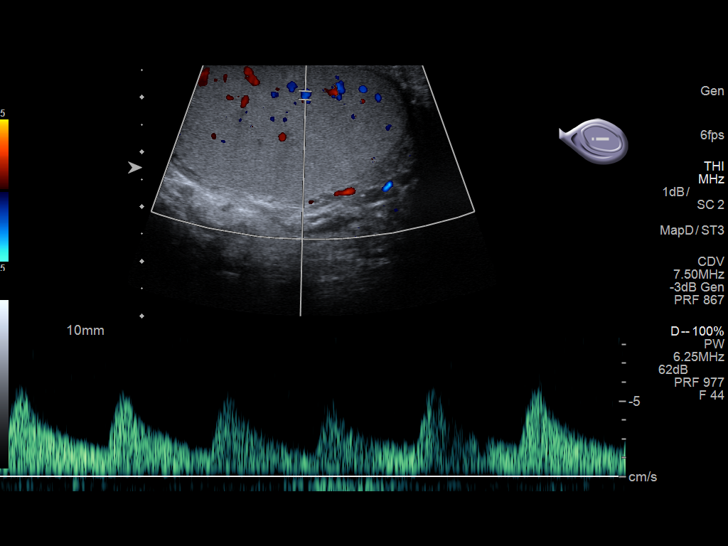
[im 32/64]
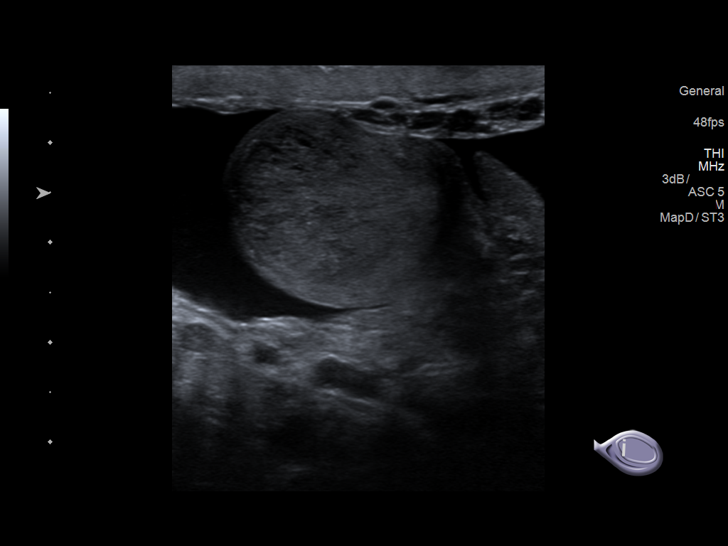
[im 37/64]
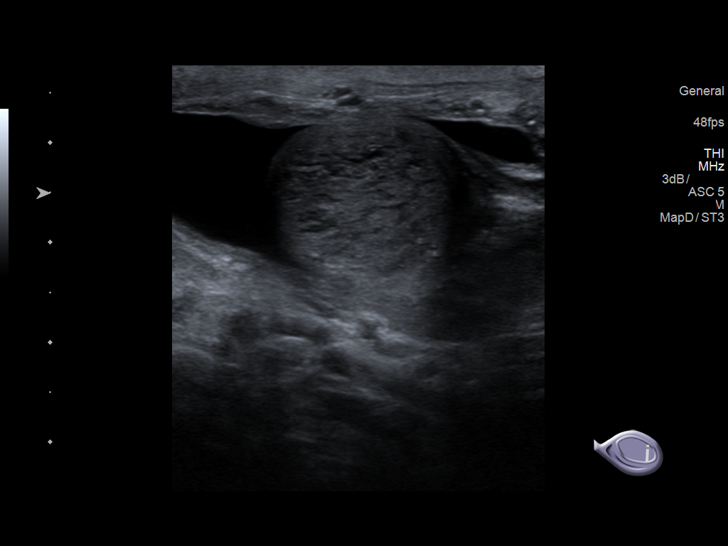
[im 43/64]
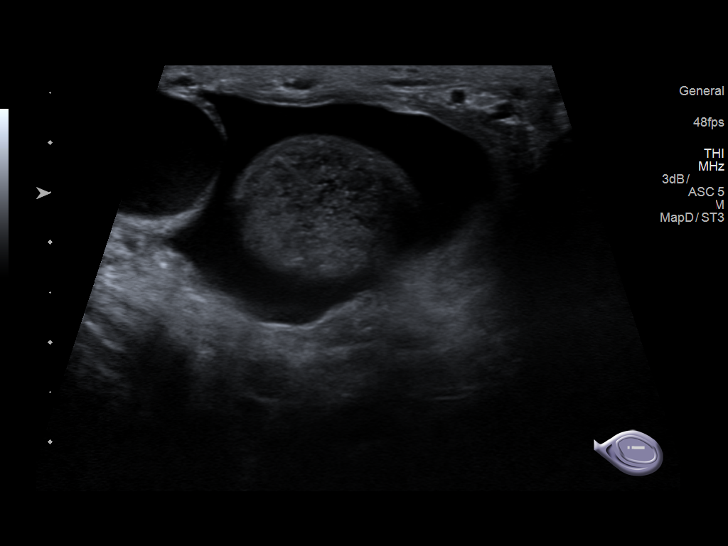
[im 48/64]
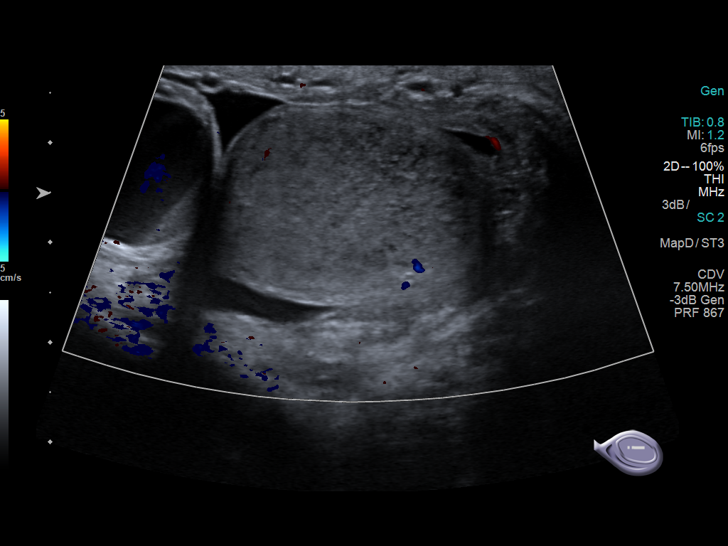
[im 53/64]
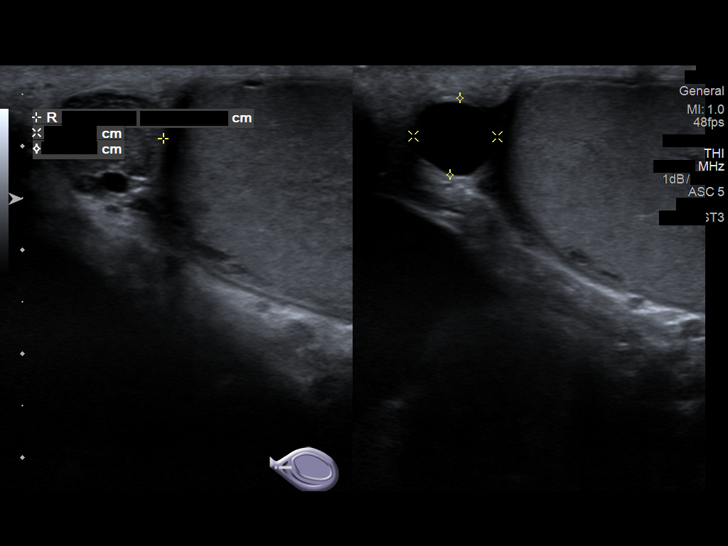
[im 58/64]
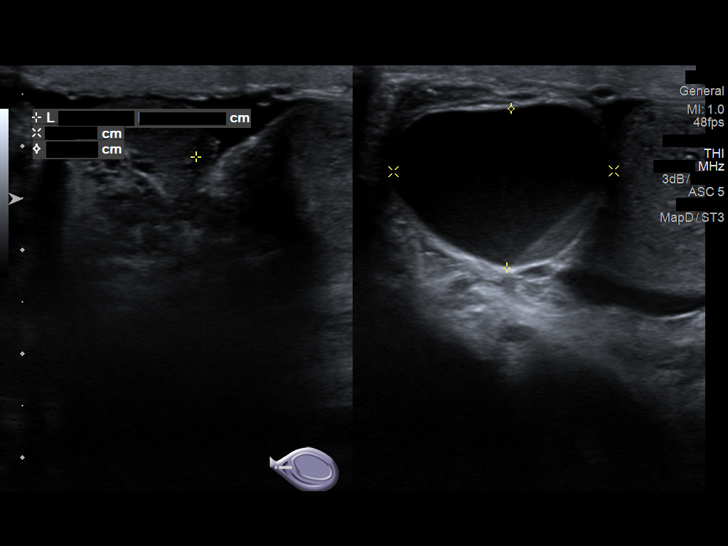
[im 64/64]
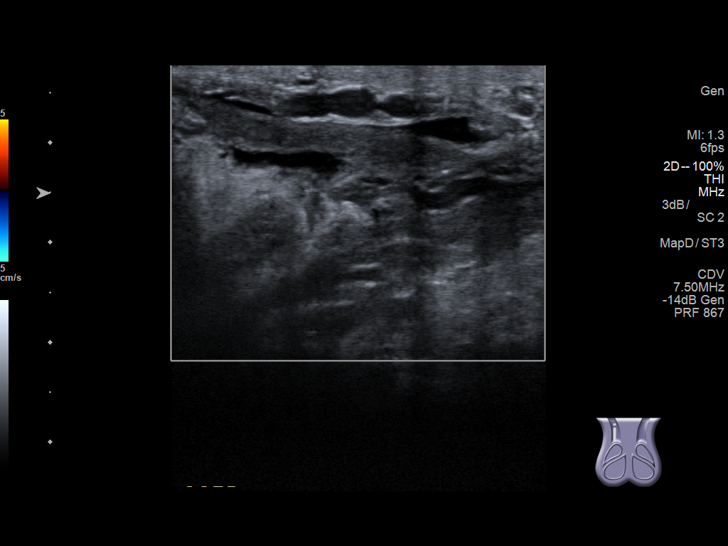

[13 of 25 positions shown; findings below may reference images not displayed]

FINDINGS: Right testicle

Measurements: 4.9 x 3.0 x 3.7 cm. No mass or microlithiasis
visualized.

Left testicle

Measurements: 3.1 x 2.1 x 2.3 cm. Ill-defined left testicular
architecture noted. Left testicular mass cannot be completely
excluded. Orchitis cannot be excluded.

Right epididymis:  9 mm cyst.

Left epididymis:  2.1 cm cys with debris.

Hydrocele:  Moderate left-sided hydrocele.

Varicocele:  None visualized.

Pulsed Doppler interrogation of both testes demonstrates normal low
resistance arterial and venous waveforms bilaterally.
IMPRESSION: 1. No focal right testicular abnormality identified. No evidence of
right testicular torsion.

2. Ill-defined echotexture left testicle. Left testicular mass
cannot be excluded. Orchitis cannot be excluded. No evidence of left
testicular torsion.

3. 2.1 cm prominent left epididymal cyst with debris. Moderate left
hydrocele.

## 2019-01-12 ENCOUNTER — Encounter (INDEPENDENT_AMBULATORY_CARE_PROVIDER_SITE_OTHER): Payer: Self-pay | Admitting: *Deleted

## 2019-09-20 ENCOUNTER — Ambulatory Visit (HOSPITAL_COMMUNITY)
Admission: RE | Admit: 2019-09-20 | Discharge: 2019-09-20 | Disposition: A | Discharge status: Home / Self Care | Payer: Medicare PPO | Source: Ambulatory Visit | Attending: Physician Assistant | Admitting: Physician Assistant

## 2019-09-20 ENCOUNTER — Other Ambulatory Visit: Payer: Self-pay

## 2019-09-20 ENCOUNTER — Other Ambulatory Visit (HOSPITAL_COMMUNITY): Payer: Self-pay | Admitting: Physician Assistant

## 2019-09-20 DIAGNOSIS — R0602 Shortness of breath: Secondary | ICD-10-CM

## 2019-09-20 DIAGNOSIS — I10 Essential (primary) hypertension: Secondary | ICD-10-CM | POA: Diagnosis not present

## 2019-09-20 DIAGNOSIS — E785 Hyperlipidemia, unspecified: Secondary | ICD-10-CM | POA: Diagnosis not present

## 2019-09-20 DIAGNOSIS — N400 Enlarged prostate without lower urinary tract symptoms: Secondary | ICD-10-CM | POA: Diagnosis not present

## 2019-09-20 DIAGNOSIS — Z0001 Encounter for general adult medical examination with abnormal findings: Secondary | ICD-10-CM | POA: Diagnosis not present

## 2019-10-03 ENCOUNTER — Other Ambulatory Visit: Payer: Self-pay

## 2019-10-03 ENCOUNTER — Encounter: Payer: Self-pay | Admitting: *Deleted

## 2019-10-03 ENCOUNTER — Ambulatory Visit (INDEPENDENT_AMBULATORY_CARE_PROVIDER_SITE_OTHER): Payer: Medicare PPO | Admitting: Cardiology

## 2019-10-03 ENCOUNTER — Telehealth: Payer: Self-pay | Admitting: Cardiology

## 2019-10-03 ENCOUNTER — Encounter: Payer: Self-pay | Admitting: Cardiology

## 2019-10-03 DIAGNOSIS — R0602 Shortness of breath: Secondary | ICD-10-CM | POA: Diagnosis not present

## 2019-10-03 NOTE — Patient Instructions (Signed)
Medication Instructions:  Continue all current medications.  Labwork: none  Testing/Procedures:  Your physician has requested that you have an echocardiogram. Echocardiography is a painless test that uses sound waves to create images of your heart. It provides your doctor with information about the size and shape of your heart and how well your heart's chambers and valves are working. This procedure takes approximately one hour. There are no restrictions for this procedure.  Office will contact with results via phone or letter.    Follow-Up: Pending test results.  Any Other Special Instructions Will Be Listed Below (If Applicable).  If you need a refill on your cardiac medications before your next appointment, please call your pharmacy.  

## 2019-10-03 NOTE — Telephone Encounter (Signed)
Pre-cert Verification for the following procedure    Echo scheduled for 10-06-2019 at [facility]

## 2019-10-03 NOTE — Progress Notes (Signed)
Clinical Summary [name] is a 74 y.o.male seen today as a new consult, referred by Dr Gerarda Fraction for the following medical problems.   1. SOB - symptoms started about 10 days ago - typically occurs with activity. Particularly with lifting light objects or walking - has had some chest pain. Left lower rib cage tightness, resolves with rest. Mild in severity.  - chronic wheezing unchanged, no recent coughing. No recent edema, no orthopnea. - 09/2019 CXR no acute process   2. Sinus bradycardia - no lightheadedness or dizziness     Past Medical History:  Diagnosis Date  . Anxiety   . Arthritis    knees, fingers  . Complication of anesthesia    per pt was very combative when coming out of anesthesia from hemorrhoid surgery 03/ 2005  . DDD (degenerative disc disease), lumbar   . Hyperlipidemia   . Hyperplasia of prostate with lower urinary tract symptoms (LUTS)   . Prostate cancer [facility]) urologist-  dr wrenn/  oncologist-  dr Tammi Klippel   dx 10-09-2016-- Stage T1c,  Gleason 3+4,  PSA 5.4,  vol 58cc  . Seasonal allergic rhinitis due to pollen   . Small fiber neuropathy neurologist-  dr Krista Blue   bilateral feet-- heaviness , bottom of feet numbness/ tingling  . Urge urinary incontinence      Allergies  Allergen Reactions  . Hydrocodone Other (See Comments)    "makes me go crazy and makes me fight" combative     Current Outpatient Medications  Medication Sig Dispense Refill  . pravastatin (PRAVACHOL) 20 MG tablet Take 20 mg by mouth every evening.     . tamsulosin (FLOMAX) 0.4 MG CAPS capsule Take 1 capsule (0.4 mg total) by mouth daily. 30 capsule 1   No current facility-administered medications for this visit.     Past Surgical History:  Procedure Laterality Date  . COLONOSCOPY N/A 01/24/2014   Procedure: COLONOSCOPY;  Surgeon: Rogene Houston, MD;  [facility];  Service: Endoscopy;  Laterality: N/A;  830  . CYSTOSCOPY N/A 02/18/2017   Procedure: SPACE OAR;   Surgeon: Irine Seal, MD;  Location: [facility];  Service: Urology;  Laterality: N/A;  . EXCISION SEBACEOUS CYST OF BACK  03/21/2010  . EXTENSIVE HEMORRHOIDECTOMY/  FISSURECTOMY  11/14/2003  . INGUINAL HERNIA REPAIR  08/31/2011   Procedure: HERNIA REPAIR INGUINAL ADULT;  Surgeon: Donato Heinz, MD;  Location: [facility];  Service: General;  Laterality: Left;  . PROSTATE BIOPSY  10-09-2016  dr Jeffie Pollock office  . RADIOACTIVE SEED IMPLANT N/A 02/18/2017   Procedure: RADIOACTIVE SEED IMPLANT/BRACHYTHERAPY IMPLANT, CYSTOSCOPY;  Surgeon: Irine Seal, MD;  Location: [facility];  Service: Urology;  Laterality: N/A;  no seeds found in bladder per Dr Roni Bread, 7 seeds implanted     Allergies  Allergen Reactions  . Hydrocodone Other (See Comments)    "makes me go crazy and makes me fight" combative      Family History  Problem Relation Age of Onset  . Heart disease Mother   . Cancer Sister        breast  . Cancer Brother        prostate  . Cancer Brother   . Cancer Brother   . Cancer Brother   . Cancer Brother   . Cancer Brother   . Anesthesia problems Neg Hx   . Hypotension Neg Hx   . Malignant hyperthermia Neg Hx   . Pseudochol deficiency Neg Hx  Social History Mr. Angeles reports that he has never smoked. He has never used smokeless tobacco. Mr. Francis reports no history of alcohol use.   Review of Systems CONSTITUTIONAL: No weight loss, fever, chills, weakness or fatigue.  HEENT: Eyes: No visual loss, blurred vision, double vision or yellow sclerae.No hearing loss, sneezing, congestion, runny nose or sore throat.  SKIN: No rash or itching.  CARDIOVASCULAR: per hpi RESPIRATORY: No shortness of breath, cough or sputum.  GASTROINTESTINAL: No anorexia, nausea, vomiting or diarrhea. No abdominal pain or blood.  GENITOURINARY: No burning on urination, no polyuria NEUROLOGICAL: No headache, dizziness, syncope, paralysis, ataxia, numbness or tingling in the  extremities. No change in bowel or bladder control.  MUSCULOSKELETAL: No muscle, back pain, joint pain or stiffness.  LYMPHATICS: No enlarged nodes. No history of splenectomy.  PSYCHIATRIC: No history of depression or anxiety.  ENDOCRINOLOGIC: No reports of sweating, cold or heat intolerance. No polyuria or polydipsia.  Marland Kitchen   Physical Examination Today's Vitals   10/03/19 0904  BP: (!) 148/86  Pulse: 72  SpO2: 98%  Weight: 220 lb (99.8 kg)  Height: 5\' 11"  (1.803 m)   Body mass index is 30.68 kg/m.  Gen: resting comfortably, no acute distress HEENT: no scleral icterus, pupils equal round and reactive, no palptable cervical adenopathy,  CV: RRR, no m/r/g, no jvd Resp: Clear to auscultation bilaterally GI: abdomen is soft, non-tender, non-distended, normal bowel sounds, no hepatosplenomegaly MSK: extremities are warm, no edema.  Skin: warm, no rash Neuro:  no focal deficits Psych: appropriate affect     Assessment and Plan  1. SOB - unclear etiology. Initial blood work including Ddimer and CXR by pcp were benign - obtain echo cardiogram, pending results would consider lexiscan   2. Sinus bradycardia - EKG today shows sinus brady with 1st degree av block - asymptomatic, monitor at this time    F/u pending echo and likely stress test   Arnoldo Lenis, M.D.

## 2019-10-06 ENCOUNTER — Other Ambulatory Visit (HOSPITAL_COMMUNITY): Payer: Medicare PPO

## 2019-10-10 ENCOUNTER — Other Ambulatory Visit: Payer: Self-pay

## 2019-10-10 ENCOUNTER — Ambulatory Visit (HOSPITAL_COMMUNITY)
Admission: RE | Admit: 2019-10-10 | Discharge: 2019-10-10 | Disposition: A | Discharge status: Home / Self Care | Payer: Medicare PPO | Source: Ambulatory Visit | Attending: Cardiology | Admitting: Cardiology

## 2019-10-10 DIAGNOSIS — R0602 Shortness of breath: Secondary | ICD-10-CM | POA: Insufficient documentation

## 2019-10-10 NOTE — Progress Notes (Signed)
*  PRELIMINARY RESULTS* Echocardiogram 2D Echocardiogram has been performed.  Leavy Cella 10/10/2019, 11:18 AM

## 2019-10-16 ENCOUNTER — Telehealth: Payer: Self-pay

## 2019-10-16 DIAGNOSIS — R079 Chest pain, unspecified: Secondary | ICD-10-CM

## 2019-10-16 NOTE — Telephone Encounter (Signed)
-----   Message from Arnoldo Lenis, MD sent at 10/16/2019 10:21 AM EST ----- Overall echo looks good, normal heart function. Please order lexiscan for SOB   Zandra Abts MD

## 2019-10-16 NOTE — Telephone Encounter (Signed)
Pt made aware, voiced understanding of plan. He agrees to lexi.

## 2019-10-26 ENCOUNTER — Other Ambulatory Visit: Payer: Self-pay

## 2019-10-26 ENCOUNTER — Encounter (HOSPITAL_COMMUNITY): Payer: Self-pay

## 2019-10-26 ENCOUNTER — Encounter (HOSPITAL_COMMUNITY)
Admission: RE | Admit: 2019-10-26 | Discharge: 2019-10-26 | Disposition: A | Discharge status: Home / Self Care | Payer: Medicare PPO | Source: Ambulatory Visit | Attending: Cardiology | Admitting: Cardiology

## 2019-10-26 ENCOUNTER — Ambulatory Visit (HOSPITAL_COMMUNITY)
Admission: RE | Admit: 2019-10-26 | Discharge: 2019-10-26 | Disposition: A | Discharge status: Home / Self Care | Payer: Medicare PPO | Source: Ambulatory Visit | Attending: Cardiology | Admitting: Cardiology

## 2019-10-26 DIAGNOSIS — R079 Chest pain, unspecified: Secondary | ICD-10-CM | POA: Diagnosis not present

## 2019-10-26 LAB — NM MYOCAR MULTI W/SPECT W/WALL MOTION / EF
LV dias vol: 140 mL (ref 62–150)
LV sys vol: 68 mL
Peak HR: 77 {beats}/min
RATE: 0.44
Rest HR: 48 {beats}/min
SDS: 0
SRS: 2
SSS: 2
TID: 1.23

## 2019-10-26 MED ORDER — TECHNETIUM TC 99M TETROFOSMIN IV KIT
30.0000 | PACK | Freq: Once | INTRAVENOUS | Status: AC | PRN
  Administered 2019-10-26: 09:00:00 32.5 via INTRAVENOUS

## 2019-10-26 MED ORDER — REGADENOSON 0.4 MG/5ML IV SOLN
INTRAVENOUS | Status: AC
  Administered 2019-10-26: 09:00:00 0.4 mg via INTRAVENOUS
  Filled 2019-10-26: qty 5

## 2019-10-26 MED ORDER — TECHNETIUM TC 99M TETROFOSMIN IV KIT
10.0000 | PACK | Freq: Once | INTRAVENOUS | Status: AC | PRN
  Administered 2019-10-26: 08:00:00 10 via INTRAVENOUS

## 2019-10-26 MED ORDER — SODIUM CHLORIDE FLUSH 0.9 % IV SOLN
INTRAVENOUS | Status: AC
  Administered 2019-10-26: 09:00:00 10 mL via INTRAVENOUS
  Filled 2019-10-26: qty 10

## 2019-10-31 ENCOUNTER — Telehealth: Payer: Self-pay | Admitting: *Deleted

## 2019-10-31 NOTE — Telephone Encounter (Signed)
-----   Message from Arnoldo Lenis, MD sent at 10/27/2019 11:44 AM EST ----- Stress test looks good. Overall his heart testing has looked very good, nothing to explain his symptoms. He needs to f/u with his pcp to consider other causes, can f/u with [place] 3 months   Zandra Abts MD

## 2019-10-31 NOTE — Telephone Encounter (Signed)
Pt voiced understanding - routed to pcp - 3 month f/u scheduled

## 2019-11-09 DIAGNOSIS — R109 Unspecified abdominal pain: Secondary | ICD-10-CM | POA: Diagnosis not present

## 2019-11-09 DIAGNOSIS — R0781 Pleurodynia: Secondary | ICD-10-CM | POA: Diagnosis not present

## 2019-11-09 DIAGNOSIS — Z6830 Body mass index (BMI) 30.0-30.9, adult: Secondary | ICD-10-CM | POA: Diagnosis not present

## 2019-11-09 DIAGNOSIS — R0600 Dyspnea, unspecified: Secondary | ICD-10-CM | POA: Diagnosis not present

## 2020-01-08 ENCOUNTER — Telehealth (INDEPENDENT_AMBULATORY_CARE_PROVIDER_SITE_OTHER): Payer: Medicare PPO | Admitting: Cardiology

## 2020-01-08 NOTE — Progress Notes (Signed)
Error, patient was no show

## 2020-04-29 ENCOUNTER — Other Ambulatory Visit: Payer: Self-pay

## 2020-04-29 ENCOUNTER — Ambulatory Visit
Admission: EM | Admit: 2020-04-29 | Discharge: 2020-04-29 | Disposition: A | Discharge status: Home / Self Care | Payer: Medicare PPO | Attending: Emergency Medicine | Admitting: Emergency Medicine

## 2020-04-29 DIAGNOSIS — Z1152 Encounter for screening for COVID-19: Secondary | ICD-10-CM

## 2020-04-29 NOTE — ED Triage Notes (Signed)
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

## 2020-05-01 LAB — SARS-COV-2, NAA 2 DAY TAT

## 2020-05-01 LAB — NOVEL CORONAVIRUS, NAA: SARS-CoV-2, NAA: DETECTED — AB

## 2020-05-08 ENCOUNTER — Ambulatory Visit: Admission: EM | Admit: 2020-05-08 | Discharge: 2020-05-08 | Discharge status: Absent without leave | Payer: Medicare PPO

## 2020-07-23 ENCOUNTER — Other Ambulatory Visit: Payer: Self-pay

## 2020-07-23 ENCOUNTER — Ambulatory Visit (HOSPITAL_COMMUNITY)
Admission: RE | Admit: 2020-07-23 | Discharge: 2020-07-23 | Disposition: A | Discharge status: Home / Self Care | Payer: Medicare PPO | Source: Ambulatory Visit | Attending: Physician Assistant | Admitting: Physician Assistant

## 2020-07-23 ENCOUNTER — Other Ambulatory Visit (HOSPITAL_COMMUNITY): Payer: Self-pay | Admitting: Physician Assistant

## 2020-07-23 DIAGNOSIS — E6609 Other obesity due to excess calories: Secondary | ICD-10-CM | POA: Diagnosis not present

## 2020-07-23 DIAGNOSIS — M5030 Other cervical disc degeneration, unspecified cervical region: Secondary | ICD-10-CM

## 2020-07-23 DIAGNOSIS — R519 Headache, unspecified: Secondary | ICD-10-CM | POA: Diagnosis not present

## 2020-07-23 DIAGNOSIS — M5136 Other intervertebral disc degeneration, lumbar region: Secondary | ICD-10-CM | POA: Diagnosis not present

## 2020-07-23 DIAGNOSIS — Z6830 Body mass index (BMI) 30.0-30.9, adult: Secondary | ICD-10-CM | POA: Diagnosis not present

## 2020-09-25 DIAGNOSIS — R7309 Other abnormal glucose: Secondary | ICD-10-CM | POA: Diagnosis not present

## 2020-09-25 DIAGNOSIS — Z6830 Body mass index (BMI) 30.0-30.9, adult: Secondary | ICD-10-CM | POA: Diagnosis not present

## 2020-09-25 DIAGNOSIS — E6609 Other obesity due to excess calories: Secondary | ICD-10-CM | POA: Diagnosis not present

## 2020-09-25 DIAGNOSIS — Z1389 Encounter for screening for other disorder: Secondary | ICD-10-CM | POA: Diagnosis not present

## 2020-09-25 DIAGNOSIS — Z0000 Encounter for general adult medical examination without abnormal findings: Secondary | ICD-10-CM | POA: Diagnosis not present

## 2020-09-25 DIAGNOSIS — Z0001 Encounter for general adult medical examination with abnormal findings: Secondary | ICD-10-CM | POA: Diagnosis not present

## 2021-03-19 ENCOUNTER — Ambulatory Visit (HOSPITAL_COMMUNITY)
Admission: RE | Admit: 2021-03-19 | Discharge: 2021-03-19 | Disposition: A | Discharge status: Home / Self Care | Payer: Medicare PPO | Source: Ambulatory Visit | Attending: Physician Assistant | Admitting: Physician Assistant

## 2021-03-19 ENCOUNTER — Other Ambulatory Visit: Payer: Self-pay

## 2021-03-19 ENCOUNTER — Other Ambulatory Visit (HOSPITAL_COMMUNITY): Payer: Self-pay | Admitting: Physician Assistant

## 2021-03-19 DIAGNOSIS — M25562 Pain in left knee: Secondary | ICD-10-CM | POA: Diagnosis not present

## 2021-03-19 DIAGNOSIS — J22 Unspecified acute lower respiratory infection: Secondary | ICD-10-CM | POA: Diagnosis not present

## 2021-03-19 DIAGNOSIS — Z681 Body mass index (BMI) 19 or less, adult: Secondary | ICD-10-CM | POA: Diagnosis not present

## 2021-03-19 DIAGNOSIS — M1712 Unilateral primary osteoarthritis, left knee: Secondary | ICD-10-CM | POA: Diagnosis not present

## 2021-03-31 DIAGNOSIS — E663 Overweight: Secondary | ICD-10-CM | POA: Diagnosis not present

## 2021-03-31 DIAGNOSIS — M1712 Unilateral primary osteoarthritis, left knee: Secondary | ICD-10-CM | POA: Diagnosis not present

## 2021-03-31 DIAGNOSIS — Z6829 Body mass index (BMI) 29.0-29.9, adult: Secondary | ICD-10-CM | POA: Diagnosis not present

## 2021-03-31 DIAGNOSIS — M25562 Pain in left knee: Secondary | ICD-10-CM | POA: Diagnosis not present

## 2021-04-28 DIAGNOSIS — M1712 Unilateral primary osteoarthritis, left knee: Secondary | ICD-10-CM | POA: Diagnosis not present

## 2021-07-18 ENCOUNTER — Ambulatory Visit
Admission: EM | Admit: 2021-07-18 | Discharge: 2021-07-18 | Disposition: A | Discharge status: Home / Self Care | Payer: Medicare PPO | Attending: Family Medicine | Admitting: Family Medicine

## 2021-07-18 ENCOUNTER — Encounter: Payer: Self-pay | Admitting: Emergency Medicine

## 2021-07-18 ENCOUNTER — Other Ambulatory Visit: Payer: Self-pay

## 2021-07-18 DIAGNOSIS — J209 Acute bronchitis, unspecified: Secondary | ICD-10-CM | POA: Diagnosis not present

## 2021-07-18 MED ORDER — PREDNISONE 20 MG PO TABS: 40.0000 mg | ORAL_TABLET | Freq: Every day | ORAL | 0 refills | Status: DC

## 2021-07-18 MED ORDER — AZITHROMYCIN 250 MG PO TABS: ORAL_TABLET | ORAL | 0 refills | Status: AC

## 2021-07-18 MED ORDER — PROMETHAZINE-DM 6.25-15 MG/5ML PO SYRP: 5.0000 mL | ORAL_SOLUTION | Freq: Four times a day (QID) | ORAL | 0 refills | Status: AC | PRN

## 2021-07-18 NOTE — Discharge Instructions (Signed)
Start the prednisone and the cough syrup right away.  If you are worsening over the next 2 to 3 days you may also start the antibiotic azithromycin.  Make sure to be drinking plenty of fluids

## 2021-07-18 NOTE — ED Triage Notes (Signed)
Productive cough - brownish sputum x 1 week.

## 2021-07-18 NOTE — ED Provider Notes (Signed)
[facility]    CSN: [id number] Arrival date & time: 07/18/21  0844      History   Chief Complaint No chief complaint on file.   HPI Bradley Brooks is a 75 y.o. male.   Patient presenting today with 1 week history of hacking productive cough, chest soreness with coughing, fatigue.  Denies fever, chills, chest pain, shortness of breath, abdominal pain, nausea vomiting or diarrhea.  Taking Mucinex with minimal relief.  No known pertinent chronic medical problems per patient.   Past Medical History:  Diagnosis Date   Anxiety    Arthritis    knees, fingers   Complication of anesthesia    per pt was very combative when coming out of anesthesia from hemorrhoid surgery 03/ 2005   DDD (degenerative disc disease), lumbar    Hyperlipidemia    Hyperplasia of prostate with lower urinary tract symptoms (LUTS)    Prostate cancer [facility]) urologist-  dr wrenn/  oncologist-  dr Tammi Klippel   dx 10-09-2016-- Stage T1c,  Gleason 3+4,  PSA 5.4,  vol 58cc   Seasonal allergic rhinitis due to pollen    Small fiber neuropathy neurologist-  dr Krista Blue   bilateral feet-- heaviness , bottom of feet numbness/ tingling   Urge urinary incontinence     Patient Active Problem List   Diagnosis Date Noted   Prostate cancer ([place]) 11/25/2016   Leg pain 10/16/2015   Paresthesia 09/09/2015    Past Surgical History:  Procedure Laterality Date   COLONOSCOPY N/A 01/24/2014   Procedure: COLONOSCOPY;  Surgeon: Rogene Houston, MD;  [facility];  Service: Endoscopy;  Laterality: N/A;  [place] N/A 02/18/2017   Procedure: SPACE OAR;  Surgeon: Irine Seal, MD;  Location: [facility];  Service: Urology;  Laterality: N/A;   EXCISION SEBACEOUS CYST OF BACK  03/21/2010   EXTENSIVE HEMORRHOIDECTOMY/  FISSURECTOMY  11/14/2003   INGUINAL HERNIA REPAIR  08/31/2011   Procedure: HERNIA REPAIR INGUINAL ADULT;  Surgeon: Donato Heinz, MD;  Location[facility];  Service: General;  Laterality:  Left;   PROSTATE BIOPSY  10-09-2016  dr Jeffie Pollock office   RADIOACTIVE SEED IMPLANT N/A 02/18/2017   Procedure: RADIOACTIVE SEED IMPLANT/BRACHYTHERAPY IMPLANT, CYSTOSCOPY;  Surgeon: Irine Seal, MD;  Location: [facility];  Service: Urology;  Laterality: N/A;  no seeds found in bladder per Dr Roni Bread, 25 seeds implanted       Home Medications    Prior to Admission medications   Medication Sig Start Date End Date Taking? Authorizing Provider  azithromycin (ZITHROMAX) 250 MG tablet Take first 2 tablets together, then 1 every day until finished. Do not start unless worsening symptoms over the next few days 07/18/21  Yes Volney American, PA-C  predniSONE (DELTASONE) 20 MG tablet Take 2 tablets (40 mg total) by mouth daily with breakfast. 07/18/21  Yes Volney American, PA-C  promethazine-dextromethorphan (PROMETHAZINE-DM) 6.25-15 MG/5ML syrup Take 5 mLs by mouth 4 (four) times daily as needed. 07/18/21  Yes Volney American, PA-C  aspirin EC 81 MG tablet Take 81 mg by mouth daily.    [provider]  Multiple Vitamin (MULTIVITAMIN) tablet Take 1 tablet by mouth daily.    [provider]    Family History Family History  Problem Relation Age of Onset   Heart disease Mother    Cancer Sister        breast   Cancer Brother  prostate   Cancer Brother    Cancer Brother    Cancer Brother    Cancer Brother    Cancer Brother    Anesthesia problems Neg Hx    Hypotension Neg Hx    Malignant hyperthermia Neg Hx    Pseudochol deficiency Neg Hx     Social History Social History   Tobacco Use   Smoking status: Never   Smokeless tobacco: Never  Vaping Use   Vaping Use: Never used  Substance Use Topics   Alcohol use: No   Drug use: No     Allergies   Hydrocodone   Review of Systems Review of Systems Per HPI  Physical Exam Triage Vital Signs ED Triage Vitals  Enc Vitals Group     BP 07/18/21 0852 118/77     Pulse Rate 07/18/21  0852 73     Resp 07/18/21 0852 18     Temp 07/18/21 0852 97.8 F (36.6 C)     Temp Source 07/18/21 0852 Oral     SpO2 07/18/21 0852 97 %     Weight --      Height --      Head Circumference --      Peak Flow --      Pain Score 07/18/21 0853 9     Pain Loc --      Pain Edu? --      Excl. in [place]? --    No data found.  Updated Vital Signs BP 118/77 (BP Location: Right Arm)   Pulse 73   Temp 97.8 F (36.6 C) (Oral)   Resp 18   SpO2 97%   Visual Acuity Right Eye Distance:   Left Eye Distance:   Bilateral Distance:    Right Eye Near:   Left Eye Near:    Bilateral Near:     Physical Exam Vitals and nursing note reviewed.  Constitutional:      Appearance: Normal appearance.  HENT:     Head: Atraumatic.     Right Ear: Tympanic membrane normal.     Left Ear: Tympanic membrane normal.     Nose: Nose normal.     Mouth/Throat:     Mouth: Mucous membranes are moist.     Pharynx: Posterior oropharyngeal erythema present. No oropharyngeal exudate.  Eyes:     Extraocular Movements: Extraocular movements intact.     Conjunctiva/sclera: Conjunctivae normal.  Cardiovascular:     Rate and Rhythm: Normal rate and regular rhythm.  Pulmonary:     Effort: Pulmonary effort is normal.     Breath sounds: Wheezing present. No rales.     Comments: Minimal scattered wheezes Musculoskeletal:        General: Normal range of motion.     Cervical back: Normal range of motion and neck supple.  Skin:    General: Skin is warm and dry.  Neurological:     General: No focal deficit present.     Mental Status: He is oriented to person, place, and time.  Psychiatric:        Mood and Affect: Mood normal.        Thought Content: Thought content normal.        Judgment: Judgment normal.     UC Treatments / Results  Labs (all labs ordered are listed, but only abnormal results are displayed) Labs Reviewed - No data to display  EKG   Radiology No results found.  Procedures Procedures  (including critical care time)  Medications Ordered  in UC Medications - No data to display  Initial Impression / Assessment and Plan / UC Course  I have reviewed the triage vital signs and the nursing notes.  Pertinent labs & imaging results that were available during my care of the patient were reviewed by me and considered in my medical decision making (see chart for details).     Suspect a postviral bronchitis, will treat with prednisone, Phenergan DM, supportive over-the-counter medications and home care.  Discussed if worsening over the next few days to start azithromycin additionally.  Return for acutely worsening symptoms.  Final Clinical Impressions(s) / UC Diagnoses   Final diagnoses:  Acute bronchitis, unspecified organism     Discharge Instructions      Start the prednisone and the cough syrup right away.  If you are worsening over the next 2 to 3 days you may also start the antibiotic azithromycin.  Make sure to be drinking plenty of fluids    ED Prescriptions     Medication Sig Dispense Auth. Provider   predniSONE (DELTASONE) 20 MG tablet Take 2 tablets (40 mg total) by mouth daily with breakfast. 10 tablet Volney American, PA-C   promethazine-dextromethorphan (PROMETHAZINE-DM) 6.25-15 MG/5ML syrup Take 5 mLs by mouth 4 (four) times daily as needed. 100 mL Volney American, PA-C   azithromycin (ZITHROMAX) 250 MG tablet Take first 2 tablets together, then 1 every day until finished. Do not start unless worsening symptoms over the next few days 6 tablet Volney American, [place]      PDMP not reviewed this encounter.   Merrie Roof [place], [place] 07/18/21 [phone]

## 2021-12-19 ENCOUNTER — Ambulatory Visit
Admission: EM | Admit: 2021-12-19 | Discharge: 2021-12-19 | Disposition: A | Discharge status: Home / Self Care | Payer: Medicare PPO | Attending: Student | Admitting: Student

## 2021-12-19 DIAGNOSIS — Z9109 Other allergy status, other than to drugs and biological substances: Secondary | ICD-10-CM | POA: Diagnosis not present

## 2021-12-19 MED ORDER — PREDNISONE 20 MG PO TABS: 40.0000 mg | ORAL_TABLET | Freq: Every day | ORAL | 0 refills | Status: AC

## 2021-12-19 NOTE — Discharge Instructions (Addendum)
-  Prednisone, 2 pills taken at the same time for 3 days in a row.  Try taking this earlier in the day as it can give you energy. Avoid NSAIDs like ibuprofen and alleve while taking this medication as they can increase your risk of stomach upset and even GI bleeding when in combination with a steroid. You can continue tylenol (acetaminophen) up to '1000mg'$  3x daily. ?-Benedryl (diphenhydramine) 25-'50mg'$  (1-2 pills) as needed for itching, up to every 6 hours.  This medication will cause drowsiness. You can alternatively take Zyrtec for a less-drowsy option.  ?-Follow-up if symptoms worsen, including congestion, drainage, cough.  ? ? ?

## 2021-12-19 NOTE — ED Triage Notes (Signed)
Pt presents with cough and sore throat with eye irritation after mowing yard yesterday  ?

## 2021-12-19 NOTE — ED Provider Notes (Signed)
?[employer] ? ? ? ?CSN: [id number] ?Arrival date & time: 12/19/21  0941 ? ? ?  ? ?History   ?Chief Complaint ?Chief Complaint  ?Patient presents with  ? Sore Throat  ? Cough  ? Eye Problem  ? ? ?HPI ?Bradley Brooks is a 76 y.o. male presenting following allergic reaction to grass/pollen. History noncontributory. States following mowing the lawn, he developed nasal congestion, sneezing, eye irritation. He picked up an over-the-counter medication which helped somewhat - he is unsure what one. States the congestion and occ cough has persisted. Has not attempted allergy medication. Denies SOB, CP, dizziness, weakness, sensation of throat closing. ? ?HPI ? ?Past Medical History:  ?Diagnosis Date  ? Anxiety   ? Arthritis   ? knees, fingers  ? Complication of anesthesia   ? per pt was very combative when coming out of anesthesia from hemorrhoid surgery 03/ 2005  ? DDD (degenerative disc disease), lumbar   ? Hyperlipidemia   ? Hyperplasia of prostate with lower urinary tract symptoms (LUTS)   ? Prostate cancer [facility]) urologist-  dr wrenn/  oncologist-  dr Tammi Klippel  ? dx 10-09-2016-- Stage T1c,  Gleason 3+4,  PSA 5.4,  vol 58cc  ? Seasonal allergic rhinitis due to pollen   ? Small fiber neuropathy neurologist-  dr Krista Blue  ? bilateral feet-- heaviness , bottom of feet numbness/ tingling  ? Urge urinary incontinence   ? ? ?Patient Active Problem List  ? Diagnosis Date Noted  ? Prostate cancer ([place]) 11/25/2016  ? Leg pain 10/16/2015  ? Paresthesia 09/09/2015  ? ? ?Past Surgical History:  ?Procedure Laterality Date  ? COLONOSCOPY N/A 01/24/2014  ? Procedure: COLONOSCOPY;  Surgeon: Rogene Houston, MD;  Location: [facility];  Service: Endoscopy;  Laterality: N/A;  830  ? CYSTOSCOPY N/A 02/18/2017  ? Procedure: SPACE OAR;  Surgeon: Irine Seal, MD;  Location: [facility];  Service: Urology;  Laterality: N/A;  ? EXCISION SEBACEOUS CYST OF BACK  03/21/2010  ? EXTENSIVE HEMORRHOIDECTOMY/  FISSURECTOMY  11/14/2003  ?  INGUINAL HERNIA REPAIR  08/31/2011  ? Procedure: HERNIA REPAIR INGUINAL ADULT;  Surgeon: Donato Heinz, MD;  Location: [facility];  Service: General;  Laterality: Left;  ? PROSTATE BIOPSY  10-09-2016  dr Jeffie Pollock office  ? RADIOACTIVE SEED IMPLANT N/A 02/18/2017  ? Procedure: RADIOACTIVE SEED IMPLANT/BRACHYTHERAPY IMPLANT, CYSTOSCOPY;  Surgeon: Irine Seal, MD;  Location: [facility];  Service: Urology;  Laterality: N/A;  no seeds found in bladder per Dr Roni Bread, 13 seeds implanted  ? ? ? ? ? ?Home Medications   ? ?Prior to Admission medications   ?Medication Sig Start Date End Date Taking? Authorizing Provider  ?predniSONE (DELTASONE) 20 MG tablet Take 2 tablets (40 mg total) by mouth daily for 3 days. Take with breakfast or lunch. Avoid NSAIDs (ibuprofen, etc) while taking this medication. 12/19/21 12/22/21 Yes Hazel Sams, PA-C  ?aspirin EC 81 MG tablet Take 81 mg by mouth daily.    [provider]  ?azithromycin (ZITHROMAX) 250 MG tablet Take first 2 tablets together, then 1 every day until finished. Do not start unless worsening symptoms over the next few days 07/18/21   Volney American, PA-C  ?Multiple Vitamin (MULTIVITAMIN) tablet Take 1 tablet by mouth daily.    [provider]  ?promethazine-dextromethorphan (PROMETHAZINE-DM) 6.25-15 MG/5ML syrup Take 5 mLs by mouth 4 (four) times daily as needed. 07/18/21   Volney American, PA-C  ? ? ?Family History ?  Family History  ?Problem Relation Age of Onset  ? Heart disease Mother   ? Cancer Sister   ?     breast  ? Cancer Brother   ?     prostate  ? Cancer Brother   ? Cancer Brother   ? Cancer Brother   ? Cancer Brother   ? Cancer Brother   ? Anesthesia problems Neg Hx   ? Hypotension Neg Hx   ? Malignant hyperthermia Neg Hx   ? Pseudochol deficiency Neg Hx   ? ? ?Social History ?Social History  ? ?Tobacco Use  ? Smoking status: Never  ? Smokeless tobacco: Never  ?Vaping Use  ? Vaping Use: Never used  ?Substance Use Topics  ?  Alcohol use: No  ? Drug use: No  ? ? ? ?Allergies   ?Hydrocodone ? ? ?Review of Systems ?Review of Systems  ?Constitutional:  Negative for appetite change, chills and fever.  ?HENT:  Positive for congestion. Negative for ear pain, rhinorrhea, sinus pressure, sinus pain and sore throat.   ?Eyes:  Negative for redness and visual disturbance.  ?Respiratory:  Positive for cough. Negative for chest tightness, shortness of breath and wheezing.   ?Cardiovascular:  Negative for chest pain and palpitations.  ?Gastrointestinal:  Negative for abdominal pain, constipation, diarrhea, nausea and vomiting.  ?Genitourinary:  Negative for dysuria, frequency and urgency.  ?Musculoskeletal:  Negative for myalgias.  ?Neurological:  Negative for dizziness, weakness and headaches.  ?Psychiatric/Behavioral:  Negative for confusion.   ?All other systems reviewed and are negative. ? ? ?Physical Exam ?Triage Vital Signs ?ED Triage Vitals  ?Enc Vitals Group  ?   BP 12/19/21 1037 (!) 160/91  ?   Pulse Rate 12/19/21 1037 61  ?   Resp 12/19/21 1037 18  ?   Temp 12/19/21 1037 99 ?F (37.2 ?C)  ?   Temp src --   ?   SpO2 12/19/21 1037 97 %  ?   Weight --   ?   Height --   ?   Head Circumference --   ?   Peak Flow --   ?   Pain Score 12/19/21 1036 4  ?   Pain Loc --   ?   Pain Edu? --   ?   Excl. in [place]? --   ? ?No data found. ? ?Updated Vital Signs ?BP (!) 160/91   Pulse 61   Temp 99 ?F (37.2 ?C)   Resp 18   SpO2 97%  ? ?Visual Acuity ?Right Eye Distance:   ?Left Eye Distance:   ?Bilateral Distance:   ? ?Right Eye Near:   ?Left Eye Near:    ?Bilateral Near:    ? ?Physical Exam ?Vitals reviewed.  ?Constitutional:   ?   General: He is not in acute distress. ?   Appearance: Normal appearance. He is not ill-appearing.  ?HENT:  ?   Head: Normocephalic and atraumatic.  ?   Right Ear: Tympanic membrane, ear canal and external ear normal. No tenderness. No middle ear effusion. There is no impacted cerumen. Tympanic membrane is not perforated,  erythematous, retracted or bulging.  ?   Left Ear: Tympanic membrane, ear canal and external ear normal. No tenderness.  No middle ear effusion. There is no impacted cerumen. Tympanic membrane is not perforated, erythematous, retracted or bulging.  ?   Nose: Nose normal. No congestion.  ?   Mouth/Throat:  ?   Mouth: Mucous membranes are moist.  ?   Pharynx:  Uvula midline. No oropharyngeal exudate or posterior oropharyngeal erythema.  ?Eyes:  ?   Extraocular Movements: Extraocular movements intact.  ?   Pupils: Pupils are equal, round, and reactive to light.  ?Cardiovascular:  ?   Rate and Rhythm: Normal rate and regular rhythm.  ?   Heart sounds: Normal heart sounds.  ?Pulmonary:  ?   Effort: Pulmonary effort is normal.  ?   Breath sounds: Normal breath sounds. No decreased breath sounds, wheezing, rhonchi or rales.  ?Abdominal:  ?   Palpations: Abdomen is soft.  ?   Tenderness: There is no abdominal tenderness. There is no guarding or rebound.  ?Lymphadenopathy:  ?   Cervical: No cervical adenopathy.  ?   Right cervical: No superficial cervical adenopathy. ?   Left cervical: No superficial cervical adenopathy.  ?Neurological:  ?   General: No focal deficit present.  ?   Mental Status: He is alert and oriented to person, place, and time.  ?Psychiatric:     ?   Mood and Affect: Mood normal.     ?   Behavior: Behavior normal.     ?   Thought Content: Thought content normal.     ?   Judgment: Judgment normal.  ? ? ? ?UC Treatments / Results  ?Labs ?(all labs ordered are listed, but only abnormal results are displayed) ?Labs Reviewed - No data to display ? ?EKG ? ? ?Radiology ?No results found. ? ?Procedures ?Procedures (including critical care time) ? ?Medications Ordered in UC ?Medications - No data to display ? ?Initial Impression / Assessment and Plan / UC Course  ?I have reviewed the triage vital signs and the nursing notes. ? ?Pertinent labs & imaging results that were available during my care of the patient were  reviewed by me and considered in my medical decision making (see chart for details). ? ?  ? ?This patient is a very pleasant 76 y.o. year old male presenting with allergies following mowing the lawn. Afebrile, nontachy.

## 2022-06-17 DIAGNOSIS — M5030 Other cervical disc degeneration, unspecified cervical region: Secondary | ICD-10-CM | POA: Diagnosis not present

## 2022-06-17 DIAGNOSIS — F419 Anxiety disorder, unspecified: Secondary | ICD-10-CM | POA: Diagnosis not present

## 2022-06-17 DIAGNOSIS — C61 Malignant neoplasm of prostate: Secondary | ICD-10-CM | POA: Diagnosis not present

## 2022-06-17 DIAGNOSIS — E782 Mixed hyperlipidemia: Secondary | ICD-10-CM | POA: Diagnosis not present

## 2022-06-17 DIAGNOSIS — Z6829 Body mass index (BMI) 29.0-29.9, adult: Secondary | ICD-10-CM | POA: Diagnosis not present

## 2022-06-17 DIAGNOSIS — D696 Thrombocytopenia, unspecified: Secondary | ICD-10-CM | POA: Diagnosis not present

## 2022-06-17 DIAGNOSIS — I10 Essential (primary) hypertension: Secondary | ICD-10-CM | POA: Diagnosis not present

## 2022-06-17 DIAGNOSIS — E039 Hypothyroidism, unspecified: Secondary | ICD-10-CM | POA: Diagnosis not present

## 2022-06-17 DIAGNOSIS — E119 Type 2 diabetes mellitus without complications: Secondary | ICD-10-CM | POA: Diagnosis not present

## 2022-06-17 DIAGNOSIS — Z1331 Encounter for screening for depression: Secondary | ICD-10-CM | POA: Diagnosis not present

## 2022-06-17 DIAGNOSIS — Z0001 Encounter for general adult medical examination with abnormal findings: Secondary | ICD-10-CM | POA: Diagnosis not present

## 2022-07-31 ENCOUNTER — Other Ambulatory Visit: Payer: Self-pay

## 2022-07-31 ENCOUNTER — Encounter (HOSPITAL_COMMUNITY): Payer: Self-pay | Admitting: *Deleted

## 2022-07-31 DIAGNOSIS — Z20822 Contact with and (suspected) exposure to covid-19: Secondary | ICD-10-CM | POA: Diagnosis not present

## 2022-07-31 DIAGNOSIS — Z7982 Long term (current) use of aspirin: Secondary | ICD-10-CM | POA: Diagnosis not present

## 2022-07-31 DIAGNOSIS — B974 Respiratory syncytial virus as the cause of diseases classified elsewhere: Secondary | ICD-10-CM | POA: Insufficient documentation

## 2022-07-31 DIAGNOSIS — R059 Cough, unspecified: Secondary | ICD-10-CM | POA: Insufficient documentation

## 2022-07-31 DIAGNOSIS — R6883 Chills (without fever): Secondary | ICD-10-CM | POA: Diagnosis not present

## 2022-07-31 LAB — RESP PANEL BY RT-PCR (RSV, FLU A&B, COVID)  RVPGX2
Influenza A by PCR: NEGATIVE
Influenza B by PCR: NEGATIVE
Resp Syncytial Virus by PCR: POSITIVE — AB
SARS Coronavirus 2 by RT PCR: NEGATIVE

## 2022-07-31 MED ORDER — ACETAMINOPHEN 325 MG PO TABS
650.0000 mg | ORAL_TABLET | Freq: Once | ORAL | Status: AC
Start: 2022-07-31 — End: 2022-07-31
  Administered 2022-07-31: 650 mg via ORAL
  Filled 2022-07-31: qty 2

## 2022-07-31 NOTE — ED Notes (Signed)
Pt states he took Vick's cold and flu PTA

## 2022-07-31 NOTE — ED Triage Notes (Signed)
Pt with chills and lower legs aching, started about an hour ago.  +cough since last week.  Denies any SOB

## 2022-08-01 ENCOUNTER — Emergency Department (HOSPITAL_COMMUNITY): Payer: Medicare PPO

## 2022-08-01 ENCOUNTER — Emergency Department (HOSPITAL_COMMUNITY)
Admission: EM | Admit: 2022-08-01 | Discharge: 2022-08-01 | Disposition: A | Discharge status: Home / Self Care | Payer: Medicare PPO | Attending: Emergency Medicine | Admitting: Emergency Medicine

## 2022-08-01 DIAGNOSIS — B338 Other specified viral diseases: Secondary | ICD-10-CM

## 2022-08-01 DIAGNOSIS — R059 Cough, unspecified: Secondary | ICD-10-CM | POA: Diagnosis not present

## 2022-08-01 MED ORDER — IBUPROFEN 400 MG PO TABS
400.0000 mg | ORAL_TABLET | Freq: Once | ORAL | Status: AC
  Administered 2022-08-01: 400 mg via ORAL
  Filled 2022-08-01: qty 1

## 2022-08-01 MED ORDER — ALBUTEROL SULFATE HFA 108 (90 BASE) MCG/ACT IN AERS
2.0000 | INHALATION_SPRAY | RESPIRATORY_TRACT | Status: DC | PRN
Start: 2022-08-01 — End: 2022-08-01
  Filled 2022-08-01: qty 6.7

## 2022-08-01 NOTE — ED Provider Notes (Signed)
[name] EMERGENCY DEPARTMENT Provider Note   CSN: [id number] Arrival date & time: 07/31/22  2225     History  Chief Complaint  Patient presents with   Chills    Bradley Brooks is a 76 y.o. male.  Patient presents to the emergency department for evaluation of chills.  Patient reports that he started to have symptoms of feeling like he was cold and shivering tonight.  He has had upper respiratory infection symptoms including a cough for several days.  No associated shortness of breath.       Home Medications Prior to Admission medications   Medication Sig Start Date End Date Taking? Authorizing Provider  aspirin EC 81 MG tablet Take 81 mg by mouth daily.    [provider]  azithromycin (ZITHROMAX) 250 MG tablet Take first 2 tablets together, then 1 every day until finished. Do not start unless worsening symptoms over the next few days 07/18/21   Volney American, PA-C  Multiple Vitamin (MULTIVITAMIN) tablet Take 1 tablet by mouth daily.    [provider]  promethazine-dextromethorphan (PROMETHAZINE-DM) 6.25-15 MG/5ML syrup Take 5 mLs by mouth 4 (four) times daily as needed. 07/18/21   Volney American, PA-C      Allergies    Hydrocodone    Review of Systems   Review of Systems  Physical Exam Updated Vital Signs BP (!) 161/95 (BP Location: Left Arm)   Pulse 76   Temp 98.6 F (37 C) (Oral)   Resp 17   Ht '5\' 11"'$  (1.803 m)   Wt 98.9 kg   SpO2 97%   BMI 30.40 kg/m  Physical Exam Vitals and nursing note reviewed.  Constitutional:      General: He is not in acute distress.    Appearance: He is well-developed.  HENT:     Head: Normocephalic and atraumatic.     Mouth/Throat:     Mouth: Mucous membranes are moist.  Eyes:     General: Vision grossly intact. Gaze aligned appropriately.     Extraocular Movements: Extraocular movements intact.     Conjunctiva/sclera: Conjunctivae normal.  Cardiovascular:     Rate and Rhythm: Normal rate  and regular rhythm.     Pulses: Normal pulses.     Heart sounds: Normal heart sounds, S1 normal and S2 normal. No murmur heard.    No friction rub. No gallop.  Pulmonary:     Effort: Pulmonary effort is normal. No respiratory distress.     Breath sounds: Normal breath sounds.  Abdominal:     Palpations: Abdomen is soft.     Tenderness: There is no abdominal tenderness. There is no guarding or rebound.     Hernia: No hernia is present.  Musculoskeletal:        General: No swelling.     Cervical back: Full passive range of motion without pain, normal range of motion and neck supple. No pain with movement, spinous process tenderness or muscular tenderness. Normal range of motion.     Right lower leg: No edema.     Left lower leg: No edema.  Skin:    General: Skin is warm and dry.     Capillary Refill: Capillary refill takes less than 2 seconds.     Findings: No ecchymosis, erythema, lesion or wound.  Neurological:     Mental Status: He is alert and oriented to person, place, and time.     GCS: GCS eye subscore is 4. GCS verbal subscore is 5. GCS  motor subscore is 6.     Cranial Nerves: Cranial nerves 2-12 are intact.     Sensory: Sensation is intact.     Motor: Motor function is intact. No weakness or abnormal muscle tone.     Coordination: Coordination is intact.  Psychiatric:        Mood and Affect: Mood normal.        Speech: Speech normal.        Behavior: Behavior normal.     ED Results / Procedures / Treatments   Labs (all labs ordered are listed, but only abnormal results are displayed) Labs Reviewed  RESP PANEL BY RT-PCR (RSV, FLU A&B, COVID)  RVPGX2 - Abnormal; Notable for the following components:      Result Value   Resp Syncytial Virus by PCR POSITIVE (*)    All other components within normal limits    EKG None  Radiology DG Chest Portable 1 View  Result Date: 08/01/2022 CLINICAL DATA:  Cough EXAM: PORTABLE CHEST 1 VIEW COMPARISON:  Chest x-ray 09/20/2019  FINDINGS: The heart size and mediastinal contours are within normal limits. Both lungs are clear. The visualized skeletal structures are unremarkable. IMPRESSION: No active disease. Electronically Signed   By: Ronney Asters M.D.   On: 08/01/2022 01:36    Procedures Procedures    Medications Ordered in ED Medications  albuterol (VENTOLIN HFA) 108 (90 Base) MCG/ACT inhaler 2 puff (has no administration in time range)  acetaminophen (TYLENOL) tablet 650 mg (650 mg Oral Given 07/31/22 2242)  ibuprofen (ADVIL) tablet 400 mg (400 mg Oral Given 08/01/22 0117)    ED Course/ Medical Decision Making/ A&P                           Medical Decision Making Amount and/or Complexity of Data Reviewed Radiology: ordered.  Risk OTC drugs. Prescription drug management.   Presents with low-grade fever.  This has resolved with Tylenol.  Remainder of his examination is unremarkable.  Lungs are clear, no wheezing or clinical signs of pneumonia.  He did undergo chest x-ray which is clear, no abnormalities noted.  Patient did test positive for RSV which explains his URI symptoms.  Continue normal outpatient treatment for URI and fever, will discharge with inhaler to be used as needed.  Given return precautions.        Final Clinical Impression(s) / ED Diagnoses Final diagnoses:  RSV infection    Rx / DC Orders ED Discharge Orders     None         Yajahira Tison, Gwenyth Allegra, MD 08/01/22 [id number]

## 2022-08-01 NOTE — Discharge Instructions (Addendum)
Use normal over-the-counter cold medication as needed for cough and congestion.  Treat fever with Tylenol and/or Motrin.  Use the inhaler as needed if you develop any mild shortness of breath.  If breathing difficulty occurs, return to the ER immediately.

## 2023-06-23 DIAGNOSIS — Z1331 Encounter for screening for depression: Secondary | ICD-10-CM | POA: Diagnosis not present

## 2023-06-23 DIAGNOSIS — E119 Type 2 diabetes mellitus without complications: Secondary | ICD-10-CM | POA: Diagnosis not present

## 2023-06-23 DIAGNOSIS — E663 Overweight: Secondary | ICD-10-CM | POA: Diagnosis not present

## 2023-06-23 DIAGNOSIS — I10 Essential (primary) hypertension: Secondary | ICD-10-CM | POA: Diagnosis not present

## 2023-06-23 DIAGNOSIS — D696 Thrombocytopenia, unspecified: Secondary | ICD-10-CM | POA: Diagnosis not present

## 2023-06-23 DIAGNOSIS — Z6828 Body mass index (BMI) 28.0-28.9, adult: Secondary | ICD-10-CM | POA: Diagnosis not present

## 2023-06-23 DIAGNOSIS — Z0001 Encounter for general adult medical examination with abnormal findings: Secondary | ICD-10-CM | POA: Diagnosis not present

## 2023-06-23 DIAGNOSIS — Z23 Encounter for immunization: Secondary | ICD-10-CM | POA: Diagnosis not present

## 2023-06-23 DIAGNOSIS — C61 Malignant neoplasm of prostate: Secondary | ICD-10-CM | POA: Diagnosis not present

## 2023-06-23 DIAGNOSIS — R7301 Impaired fasting glucose: Secondary | ICD-10-CM | POA: Diagnosis not present

## 2024-07-24 ENCOUNTER — Encounter (HOSPITAL_COMMUNITY): Payer: Self-pay | Admitting: Emergency Medicine

## 2024-07-24 ENCOUNTER — Other Ambulatory Visit: Payer: Self-pay

## 2024-07-24 ENCOUNTER — Emergency Department (HOSPITAL_COMMUNITY)
Admission: EM | Admit: 2024-07-24 | Discharge: 2024-07-24 | Disposition: A | Discharge status: Home / Self Care | Attending: Emergency Medicine | Admitting: Emergency Medicine

## 2024-07-24 ENCOUNTER — Emergency Department (HOSPITAL_COMMUNITY)

## 2024-07-24 DIAGNOSIS — S93402A Sprain of unspecified ligament of left ankle, initial encounter: Secondary | ICD-10-CM

## 2024-07-24 MED ORDER — IBUPROFEN 400 MG PO TABS
400.0000 mg | ORAL_TABLET | Freq: Once | ORAL | Status: AC
  Administered 2024-07-24: 400 mg via ORAL
  Filled 2024-07-24: qty 1

## 2024-07-24 MED ORDER — ACETAMINOPHEN 500 MG PO TABS
1000.0000 mg | ORAL_TABLET | Freq: Once | ORAL | Status: AC
  Administered 2024-07-24: 1000 mg via ORAL
  Filled 2024-07-24: qty 2

## 2024-07-24 NOTE — Discharge Instructions (Signed)
 Were evaluated today for an ankle injury.  There was no fracture or dislocation.  Use the brace, keep your foot elevated, use ice for 15 to 20 minutes at a time several times a day.  Limit walking on the ankle for next several days, and gradually start putting weight back on.  Follow-up with PCP and/orthopedics.  If you have new or worsening symptoms come back to the ER otherwise.

## 2024-07-24 NOTE — ED Triage Notes (Signed)
 Pt states he fell in the snow and twisted his left ankle.

## 2024-07-24 NOTE — ED Provider Notes (Signed)
 [place] EMERGENCY DEPARTMENT AT [facility] Provider Note   CSN: [id number] Arrival date & time: 07/24/24  2123     Patient presents with: Bradley Brooks is a 78 y.o. male.  He has a history of arthritis, degenerative disc disease, hyperlipidemia, prostate cancer.  Presents ER today for evaluation of left ankle pain.  He states he was walking down a hill but had some smell on it, and twisted the left ankle.  He has swelling and pain.  Happened several hours ago.  He is able to ambulate.  Denies numbness or tingling.  Denies fall or head injury.  No knee or hip pain or other injuries.    Fall       Prior to Admission medications   Medication Sig Start Date End Date Taking? Authorizing Provider  aspirin EC 81 MG tablet Take 81 mg by mouth daily.    [provider]  azithromycin  (ZITHROMAX ) 250 MG tablet Take first 2 tablets together, then 1 every day until finished. Do not start unless worsening symptoms over the next few days 07/18/21   Stuart Vernell Norris, PA-C  Multiple Vitamin (MULTIVITAMIN) tablet Take 1 tablet by mouth daily.    [provider]  promethazine -dextromethorphan (PROMETHAZINE -DM) 6.25-15 MG/5ML syrup Take 5 mLs by mouth 4 (four) times daily as needed. 07/18/21   Stuart Vernell Norris, PA-C    Allergies: Hydrocodone     Review of Systems  Updated Vital Signs BP (!) 136/99   Pulse 78   Temp 98.3 F (36.8 C) (Oral)   Resp 17   Ht 5' 11 (1.803 m)   Wt 99 kg   SpO2 98%   BMI 30.44 kg/m   Physical Exam Vitals and nursing note reviewed.  Constitutional:      General: He is not in acute distress.    Appearance: He is well-developed.  HENT:     Head: Normocephalic and atraumatic.  Eyes:     Conjunctiva/sclera: Conjunctivae normal.  Cardiovascular:     Rate and Rhythm: Normal rate and regular rhythm.     Heart sounds: No murmur heard. Pulmonary:     Effort: Pulmonary effort is normal. No respiratory distress.      Breath sounds: Normal breath sounds.  Abdominal:     Palpations: Abdomen is soft.     Tenderness: There is no abdominal tenderness.  Musculoskeletal:        General: No swelling.     Cervical back: Neck supple.     Comments: Mild soft tissue swelling lateral aspect of left ankle with tenderness over the left lateral malleolus.  DP and PT pulses in the foot are intact.  There is tenderness and tenderness noted to the fifth negative.  No stress over the fibular head on the left leg.  No left foot tenderness.  Skin:    General: Skin is warm and dry.     Capillary Refill: Capillary refill takes less than 2 seconds.  Neurological:     General: No focal deficit present.     Mental Status: He is alert and oriented to person, place, and time.  Psychiatric:        Mood and Affect: Mood normal.     (all labs ordered are listed, but only abnormal results are displayed) Labs Reviewed - No data to display  EKG: None  Radiology: No results found.   Procedures   Medications Ordered in the ED - No data to display  Medical Decision Making Differential diagnosis includes but limited to fracture, sprain, strain, contusion, dislocation, other  ED course: Patient wheeled to ER for left ankle pain and swelling after twisting his ankle several days ago.  He does have swelling of the lateral malleolus of the left ankle with tenderness to this area.  Left ankle x-ray was reviewed and interpreted by me.  There is no fracture or dislocation.  I agree with radiology reading.  Will plan on Aircast, RICE therapy and outpatient follow-up with PCP and/orthopedics.    Amount and/or Complexity of Data Reviewed Radiology: ordered and independent interpretation performed. Decision-making details documented in ED Course.  Risk OTC drugs. Prescription drug management.        Final diagnoses:  None    ED Discharge Orders     None          Suellen Sherran LABOR, PA-C 07/24/24 2243

## 2024-10-10 ENCOUNTER — Ambulatory Visit
